# Patient Record
Sex: Male | Born: 1985 | Race: White | Hispanic: No | Marital: Single | Smoking: Former smoker
Health system: Southern US, Community
[De-identification: ages and names within clinical notes are randomized; demographics above are authoritative.]

## PROBLEM LIST (undated history)

## (undated) VITALS — BP 117/75 | HR 81 | Temp 97.5°F | Resp 16 | Ht 70.0 in | Wt 147.0 lb

## (undated) DIAGNOSIS — F329 Major depressive disorder, single episode, unspecified: Secondary | ICD-10-CM

## (undated) DIAGNOSIS — F602 Antisocial personality disorder: Secondary | ICD-10-CM

## (undated) DIAGNOSIS — R569 Unspecified convulsions: Secondary | ICD-10-CM

## (undated) DIAGNOSIS — F32A Depression, unspecified: Secondary | ICD-10-CM

## (undated) HISTORY — PX: KNEE SURGERY: SHX244

---

## 2007-07-16 ENCOUNTER — Emergency Department (HOSPITAL_COMMUNITY): Admission: EM | Admit: 2007-07-16 | Discharge: 2007-07-16 | Payer: Self-pay | Admitting: Emergency Medicine

## 2007-08-08 ENCOUNTER — Ambulatory Visit (HOSPITAL_COMMUNITY): Admission: RE | Admit: 2007-08-08 | Discharge: 2007-08-08 | Payer: Self-pay | Admitting: Orthopedic Surgery

## 2007-08-27 ENCOUNTER — Ambulatory Visit (HOSPITAL_BASED_OUTPATIENT_CLINIC_OR_DEPARTMENT_OTHER): Admission: RE | Admit: 2007-08-27 | Discharge: 2007-08-28 | Payer: Self-pay | Admitting: Orthopedic Surgery

## 2007-11-03 ENCOUNTER — Emergency Department (HOSPITAL_COMMUNITY): Admission: EM | Admit: 2007-11-03 | Discharge: 2007-11-03 | Payer: Self-pay | Admitting: Emergency Medicine

## 2007-11-18 ENCOUNTER — Emergency Department (HOSPITAL_COMMUNITY): Admission: EM | Admit: 2007-11-18 | Discharge: 2007-11-18 | Payer: Self-pay | Admitting: Emergency Medicine

## 2007-11-24 ENCOUNTER — Ambulatory Visit (HOSPITAL_COMMUNITY): Admission: RE | Admit: 2007-11-24 | Discharge: 2007-11-24 | Payer: Self-pay | Admitting: Emergency Medicine

## 2009-12-22 ENCOUNTER — Encounter: Admission: RE | Admit: 2009-12-22 | Discharge: 2009-12-22 | Payer: Self-pay | Admitting: Orthopedic Surgery

## 2010-12-03 ENCOUNTER — Encounter: Payer: Self-pay | Admitting: Orthopedic Surgery

## 2010-12-09 ENCOUNTER — Emergency Department (HOSPITAL_COMMUNITY)
Admission: EM | Admit: 2010-12-09 | Discharge: 2010-12-09 | Payer: Self-pay | Source: Home / Self Care | Admitting: Emergency Medicine

## 2010-12-09 LAB — COMPREHENSIVE METABOLIC PANEL
ALT: 31 U/L (ref 0–53)
Albumin: 4.1 g/dL (ref 3.5–5.2)
CO2: 26 mEq/L (ref 19–32)
Chloride: 109 mEq/L (ref 96–112)
Sodium: 142 mEq/L (ref 135–145)
Total Protein: 6.7 g/dL (ref 6.0–8.3)

## 2010-12-09 LAB — DIFFERENTIAL
Basophils Absolute: 0 10*3/uL (ref 0.0–0.1)
Eosinophils Relative: 3 % (ref 0–5)
Neutrophils Relative %: 47 % (ref 43–77)

## 2010-12-09 LAB — RAPID URINE DRUG SCREEN, HOSP PERFORMED
Barbiturates: NOT DETECTED
Cocaine: POSITIVE — AB
Tetrahydrocannabinol: NOT DETECTED

## 2010-12-09 LAB — CBC
Hemoglobin: 14.6 g/dL (ref 13.0–17.0)
MCV: 85.3 fL (ref 78.0–100.0)
RBC: 4.96 MIL/uL (ref 4.22–5.81)

## 2011-03-27 NOTE — Op Note (Signed)
NAMEGARRY, Caleb Rowe              ACCOUNT NO.:  [id number]   MEDICAL RECORD NO.:  [id number]          PATIENT TYPE:  AMB   LOCATION:  DSC                          FACILITY:  [facility]   PHYSICIAN:  Dyke Brackett, M.D.    DATE OF BIRTH:  [birth year]   DATE OF PROCEDURE:  08/27/2007  DATE OF DISCHARGE:  08/28/2007                               OPERATIVE REPORT   PRE AND POSTOPERATIVE DIAGNOSIS:  Anterior cruciate ligament tear, left  knee.   OPERATION:  Bone tendon bone autograft anterior cruciate ligament tear.  Repeat reconstruction.   SURGEON:  Dyke Brackett, M.D.   ASSISTANT:  Arnoldo Morale, PA   TOURNIQUET TIME:  1 hour 15 minutes.   DESCRIPTION OF PROCEDURE:  Sterile prep and drape, exsanguination of  leg, inflation of the tourniquet to 350, complete ACL tear noted,  significant laxity, positive Lachman.  No other abnormalities noted  relative to intra-articular pathology. 10-mm bone-to-bone autograft  reconstruction was harvested, prepared back table the standard fashion  room and harvested through a midline incision.  Notch was moderately  narrowed, notchplasty was carried out, guide pin was placed on the  anterior medial aspect of the tibia, over drilled for tunnel length  between 50 and 55 mm and then the Arthrex system used for the tibia to  create a pin hole on the femoral side at about the 1 o'clock position  for the left knee, over drilled with 10 mm reamer followed by placement  of the graft after a notch had been placed for the guide pin.  Guide pin  was placed with a 7 x 25 mm screw on the femur and a 7 x 20 on the  tibia.  Approximately 50% of the graft was in contact inside the tunnel  and 50% outside the tunnel on the tibial side, but a good stable graft  was obtained.  Lachman was traced, no impingement noted.  The donor site  was bone grafted on the patella followed by a running #1 Vicryl on the  tendon and interrupted 0-0 and 2-0 Vicryl.  Lightly compressive  sterile  dressing applied.  Taken to recovery room in stable condition.  Tourniquet was released after application of dressing.      Dyke Brackett, M.D.  Electronically Signed     WDC/MEDQ  D:  08/27/2007  T:  08/28/2007  Job:  [id number]

## 2011-08-22 LAB — POCT HEMOGLOBIN-HEMACUE: Operator id: 128471

## 2012-04-09 ENCOUNTER — Emergency Department: Payer: Self-pay | Admitting: *Deleted

## 2012-04-11 ENCOUNTER — Emergency Department: Payer: Self-pay | Admitting: Emergency Medicine

## 2012-04-18 ENCOUNTER — Encounter (HOSPITAL_COMMUNITY): Payer: Self-pay | Admitting: *Deleted

## 2012-04-18 ENCOUNTER — Emergency Department (HOSPITAL_COMMUNITY)
Admission: EM | Admit: 2012-04-18 | Discharge: 2012-04-18 | Disposition: A | Discharge status: Home / Self Care | Payer: BC Managed Care – PPO | Attending: Emergency Medicine | Admitting: Emergency Medicine

## 2012-04-18 DIAGNOSIS — M25519 Pain in unspecified shoulder: Secondary | ICD-10-CM | POA: Insufficient documentation

## 2012-04-18 MED ORDER — TRAMADOL HCL 50 MG PO TABS: 50.0000 mg | ORAL_TABLET | Freq: Four times a day (QID) | ORAL | Status: AC | PRN

## 2012-04-18 MED ORDER — KETOROLAC TROMETHAMINE 60 MG/2ML IM SOLN
60.0000 mg | Freq: Once | INTRAMUSCULAR | Status: AC
  Administered 2012-04-18: 60 mg via INTRAMUSCULAR
  Filled 2012-04-18: qty 2

## 2012-04-18 NOTE — Discharge Instructions (Signed)
Acromioclavicular Injuries  The AC (acromioclavicular) joint is the joint in the shoulder where the collarbone (clavicle) meets the shoulder blade (scapula). The part of the shoulder blade connected to the collarbone is called the acromion. Common problems with and treatments for the AC joint are detailed below.  ARTHRITIS  Arthritis occurs when the joint has been injured and the smooth padding between the joints (cartilage) is lost. This is the wear and tear seen in most joints of the body if they have been overused. This causes the joint to produce pain and swelling which is worse with activity.   AC JOINT SEPARATION  AC joint separation means that the ligaments connecting the acromion of the shoulder blade and collarbone have been damaged, and the two bones no longer line up. AC separations can be anywhere from mild to severe, and are "graded" depending upon which ligaments are torn and how badly they are torn.   Grade I Injury: the least damage is done, and the AC joint still lines up.   Grade II Injury: damage to the ligaments which reinforce the AC joint. In a Grade II injury, these ligaments are stretched but not entirely torn. When stressed, the AC joint becomes painful and unstable.   Grade III Injury: AC and secondary ligaments are completely torn, and the collarbone is no longer attached to the shoulder blade. This results in deformity; a prominence of the end of the clavicle.  AC JOINT FRACTURE  AC joint fracture means that there has been a break in the bones of the AC joint, usually the end of the clavicle.  TREATMENT  TREATMENT OF AC ARTHRITIS   There is currently no way to replace the cartilage damaged by arthritis. The best way to improve the condition is to decrease the activities which aggravate the problem. Application of ice to the joint helps decrease pain and soreness (inflammation). The use of non-steroidal anti-inflammatory medication is helpful.   If less conservative measures do not  work, then cortisone shots (injections) may be used. These are anti-inflammatories; they decrease the soreness in the joint and swelling.   If non-surgical measures fail, surgery may be recommended. The procedure is generally removal of a portion of the end of the clavicle. This is the part of the collarbone closest to your acromion which is stabilized with ligaments to the acromion of the shoulder blade. This surgery may be performed using a tube-like instrument with a light (arthroscope) for looking into a joint. It may also be performed as an open surgery through a small incision by the surgeon. Most patients will have good range of motion within 6 weeks and may return to all activity including sports by 8-12 weeks, barring complications.  TREATMENT OF AN AC SEPARATION   The initial treatment is to decrease pain. This is best accomplished by immobilizing the arm in a sling and placing an ice pack to the shoulder for 20 to 30 minutes every 2 hours as needed. As the pain starts to subside, it is important to begin moving the fingers, wrist, elbow and eventually the shoulder in order to prevent a stiff or "frozen" shoulder. Instruction on when and how much to move the shoulder will be provided by your caregiver. The length of time needed to regain full motion and function depends on the amount or grade of the injury. Recovery from a Grade I AC separation usually takes 10 to 14 days, whereas a Grade III may take 6 to 8 weeks.   Grade   I and II separations usually do not require surgery. Even Grade III injuries usually allow return to full activity with few restrictions. Treatment is also based on the activity demands of the injured shoulder. For example, a high level quarterback with an injured throwing arm will receive more aggressive treatment than someone with a desk job who rarely uses his/her arm for strenuous activities. In some cases, a painful lump may persist which could require a later surgery. Surgery  can be very successful, but the benefits must be weighed against the potential risks.  TREATMENT OF AN AC JOINT FRACTURE  Fracture treatment depends on the type of fracture. Sometimes a splint or sling may be all that is required. Other times surgery may be required for repair. This is more frequently the case when the ligaments supporting the clavicle are completely torn. Your caregiver will help you with these decisions and together you can decide what will be the best treatment.  HOME CARE INSTRUCTIONS    Apply ice to the injury for 15 to 20 minutes each hour while awake for 2 days. Put the ice in a plastic bag and place a towel between the bag of ice and skin.   If a sling has been applied, wear it constantly for as long as directed by your caregiver, even at night. The sling or splint can be removed for bathing or showering or as directed. Be sure to keep the shoulder in the same place as when the sling is on. Do not lift the arm.   If a figure-of-eight splint has been applied it should be tightened gently by another person every day. Tighten it enough to keep the shoulders held back. Allow enough room to place the index finger between the body and strap. Loosen the splint immediately if there is numbness or tingling in the hands.   Take over-the-counter or prescription medicines for pain, discomfort or fever as directed by your caregiver.   If you or your child has received a follow up appointment, it is very important to keep that appointment in order to avoid long term complications, chronic pain or disability.  SEEK MEDICAL CARE IF:    The pain is not relieved with medications.   There is increased swelling or discoloration that continues to get worse rather than better.   You or your child has been unable to follow up as instructed.   There is progressive numbness and tingling in the arm, forearm or hand.  SEEK IMMEDIATE MEDICAL CARE IF:    The arm is numb, cold or pale.   There is increasing  pain in the hand, forearm or fingers.  MAKE SURE YOU:    Understand these instructions.   Will watch your condition.   Will get help right away if you are not doing well or get worse.  Document Released: 08/08/2005 Document Revised: 10/18/2011 Document Reviewed: 01/31/2009  Exit[employer] Patient Information 2012 [employer].

## 2012-04-18 NOTE — ED Provider Notes (Signed)
History     CSN: [id number]  Arrival date & time 04/18/12  0805   First MD Initiated Contact with Patient 04/18/12 0815      Chief Complaint  Patient presents with  . Neck Pain    (Consider location/radiation/quality/duration/timing/severity/associated sxs/prior treatment) HPI  Patient presents to the ED because of a shoulder injury from a couple of days ago still hurts. He was seen at [facility] and given Flexeril which he says does not help his pain and only makes him feel very sleepy. 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 He denies any numbness or tingling in his arm. He denies weakness. Xrays were done at [facility] and were normal. He has an appointment with Dr. Rosita Kea (ortho) coming up on the 13th. Patient is in no acute distress needs work note and would like to try a different pain medication. No bowel or urinary incontinence.   History reviewed. No pertinent past medical history.  Past Surgical History  Procedure Date  . Knee surgery     No family history on file.  History  Substance Use Topics  . Smoking status: Never Smoker   . Smokeless tobacco: Not on file  . Alcohol Use: No      Review of Systems   HEENT: denies blurry vision or change in hearing PULMONARY: Denies difficulty breathing and SOB CARDIAC: denies chest pain or heart palpitations MUSCULOSKELETAL:  denies being unable to ambulate ABDOMEN AL: denies abdominal pain GU: denies loss of bowel or urinary control NEURO: denies numbness and tingling in extremities    Allergies  Review of patient's allergies indicates no known allergies.  Home Medications   Current Outpatient Rx  Name Route Sig Dispense Refill  . BUPROPION HCL ER (XL) 150 MG PO TB24 Oral Take 150 mg by mouth daily.    . SERTRALINE HCL 100 MG PO TABS Oral Take 100 mg by mouth daily.    . TRAMADOL HCL 50 MG PO TABS Oral Take 1 tablet (50 mg total) by mouth every 6 (six) hours as  needed for pain. 15 tablet 0    BP 140/88  Pulse 87  Temp(Src) 97.6 F (36.4 C) (Oral)  Resp 20  SpO2 99%  Physical Exam  Nursing note and vitals reviewed. Constitutional: He appears well-developed and well-nourished. No distress.  HENT:  Head: Normocephalic and atraumatic.  Eyes: Pupils are equal, round, and reactive to light.  Neck: Normal range of motion. Neck supple.  Cardiovascular: Normal rate and regular rhythm.   Pulmonary/Chest: Effort normal.  Abdominal: Soft.  Musculoskeletal:       Right shoulder: He exhibits decreased range of motion (due to pain), tenderness (to palpation), deformity, pain and spasm. He exhibits no bony tenderness, no swelling, no effusion, no crepitus, no laceration, normal pulse and normal strength.        Equal strength to bilateral upper extremities. Neurosensory  function adequate to both arms. Skin color is normal. Skin is warm and moist. I see no step off deformity of the neck, no bony tenderness. Pt is able to ambulate without limp. Pain is relieved when sitting in certain positions. ROM is decreased due to pain. No crepitus, laceration, effusion, swelling.  Pulses are normal   Neurological: He is alert.  Skin: Skin is warm and dry.    ED Course  Procedures (including critical care time)  Labs Reviewed - No data to display No results found.   1. Shoulder  pain       MDM  Patient given 60mg  IM Toradol in ED and an rx for Ultram. He has follow-up appointment with Ortho on the 13th. I gave patient a work note and advised him to stretch, rest and Ice/heat shoulder.  Pt has been advised of the symptoms that warrant their return to the ED. Patient has voiced understanding and has agreed to follow-up with the PCP or specialist.         Dorthula Matas, PA 04/18/12 [phone]

## 2012-04-18 NOTE — ED Notes (Signed)
Patient states he pulled something in his neck at work.  He was seen at [facility] and given flexeril,  Reports this meds just made him sleepy but did not help his pain.  He performs heavy lifting at work

## 2012-04-18 NOTE — ED Provider Notes (Signed)
Medical screening examination/treatment/procedure(s) were performed by non-physician practitioner and as supervising physician I was immediately available for consultation/collaboration.   Lurdes Haltiwanger, MD 04/18/12 1617 

## 2012-06-09 ENCOUNTER — Encounter (HOSPITAL_COMMUNITY): Payer: Self-pay | Admitting: Emergency Medicine

## 2012-06-09 ENCOUNTER — Emergency Department (HOSPITAL_COMMUNITY)
Admission: EM | Admit: 2012-06-09 | Discharge: 2012-06-10 | Disposition: A | Discharge status: Other Acute Inpatient Hospital | Payer: BC Managed Care – PPO | Attending: Emergency Medicine | Admitting: Emergency Medicine

## 2012-06-09 DIAGNOSIS — F1310 Sedative, hypnotic or anxiolytic abuse, uncomplicated: Secondary | ICD-10-CM | POA: Insufficient documentation

## 2012-06-09 DIAGNOSIS — F1110 Opioid abuse, uncomplicated: Secondary | ICD-10-CM

## 2012-06-09 HISTORY — DX: Major depressive disorder, single episode, unspecified: F32.9

## 2012-06-09 HISTORY — DX: Unspecified convulsions: R56.9

## 2012-06-09 HISTORY — DX: Antisocial personality disorder: F60.2

## 2012-06-09 HISTORY — DX: Depression, unspecified: F32.A

## 2012-06-09 LAB — ACETAMINOPHEN LEVEL: Acetaminophen (Tylenol), Serum: <15 ug/mL (ref 10–30)

## 2012-06-09 LAB — COMPREHENSIVE METABOLIC PANEL
ALT: 13 U/L (ref 0–53)
Albumin: 4.4 g/dL (ref 3.5–5.2)
Alkaline Phosphatase: 61 U/L (ref 39–117)
BUN: 14 mg/dL (ref 6–23)
CO2: 30 mEq/L (ref 19–32)
Calcium: 9.8 mg/dL (ref 8.4–10.5)
Chloride: 101 mEq/L (ref 96–112)
GFR calc Af Amer: >90 mL/min (ref >90)
GFR calc non Af Amer: >90 mL/min (ref >90)
Glucose, Bld: 101 mg/dL — ABNORMAL HIGH (ref 70–99)
Sodium: 140 mEq/L (ref 135–145)
Total Bilirubin: 0.6 mg/dL (ref 0.3–1.2)

## 2012-06-09 LAB — RAPID URINE DRUG SCREEN, HOSP PERFORMED
Amphetamines: NOT DETECTED
Barbiturates: POSITIVE — AB
Benzodiazepines: NOT DETECTED
Cocaine: NOT DETECTED
Opiates: NOT DETECTED
Tetrahydrocannabinol: NOT DETECTED

## 2012-06-09 LAB — ETHANOL: Alcohol, Ethyl (B): <11 mg/dL (ref 0–11)

## 2012-06-09 LAB — CBC
Hemoglobin: 15.7 g/dL (ref 13.0–17.0)
MCV: 86.8 fL (ref 78.0–100.0)
RBC: 5.14 MIL/uL (ref 4.22–5.81)

## 2012-06-09 MED ORDER — NICOTINE 21 MG/24HR TD PT24
21.0000 mg | MEDICATED_PATCH | Freq: Every day | TRANSDERMAL | Status: DC
  Administered 2012-06-09: 21 mg via TRANSDERMAL
  Filled 2012-06-09: qty 1

## 2012-06-09 MED ORDER — ALUM & MAG HYDROXIDE-SIMETH 200-200-20 MG/5ML PO SUSP: 30.0000 mL | ORAL | Status: DC | PRN

## 2012-06-09 MED ORDER — ONDANSETRON HCL 4 MG PO TABS: 4.0000 mg | ORAL_TABLET | Freq: Three times a day (TID) | ORAL | Status: DC | PRN

## 2012-06-09 MED ORDER — ACETAMINOPHEN 325 MG PO TABS: 650.0000 mg | ORAL_TABLET | ORAL | Status: DC | PRN

## 2012-06-09 MED ORDER — LORAZEPAM 1 MG PO TABS: 1.0000 mg | ORAL_TABLET | Freq: Three times a day (TID) | ORAL | Status: DC | PRN

## 2012-06-09 NOTE — ED Provider Notes (Addendum)
Requesting detox from narcotics. Presently patient alert Glasgow Coma Score 15 ambulatory no distress  Doug Sou, MD 06/09/12 1119  Doug Sou, MD 06/09/12 1728

## 2012-06-09 NOTE — ED Notes (Signed)
Pt states he wants detox from heroin and suboxone.  Pt states that "if it's around I'm going to use it."  Denies SI/HI but says he felt suicidal last week when his girlfriend confronted him about his drug use.  Very pleasant during assessment.

## 2012-06-09 NOTE — ED Notes (Signed)
Pt presenting to ed with c/o wanting detox from pills pt states he is taking any pill that he can get his hands on. Pt states he wants to hurt himself and others but he does not have an exact plan. Pt is alert and oriented at this time. Pt states he also has noticed he has a very bad temper lately and anything sets him off.

## 2012-06-09 NOTE — BH Assessment (Signed)
Assessment Note   Caleb Rowe is a 26 y.o. male who presents to [facility] with req for detox from Pain Pills( Suboxone, Percocet, Roxicet).  Pt denies SI/HI/Psych.  Pt reports the following: pt has been abusing pain pills for 3 yrs, says started using b/c "peers suggested it" and it enhanced his "partying experience".  Pt has not prior SA treatment and no complaints of current w/d sxs, however states that when he experiences w/d sxs, it is tremors, nausea, body aches, and restless legs.  Pt has no criminal chgs or court dates.  Pt is prescribed 150mg  of Zoloft and last used on 06/08/12.  Pt uses: Percocet 20-10mg s, 4x's wkly, last use was 1 wk ago; Roxicet(Roxys) 6-30mg s, 4 x's wkly, last use was 1 wk ago; Suboxone 2-8mg s film strips daily, last use was 06/08/12.  Pt says he came in today for treatment b/c of relational issues with girlfriend secondary to SA--confronted him about drug use. Pt + for  Barbiturates in UDS, says he has also used his girlfriend's meds for migraine HA, unsure of the name of the medication.    Axis I: Opioid Dep; Mood Disorder NOS  Axis II: Deferred Axis III:  Past Medical History  Diagnosis Date  . Seizures   . Depression   . Sociopathic personality disorder    Axis IV: other psychosocial or environmental problems, problems related to social environment and problems with primary support group Axis V: 51-60 moderate symptoms  Past Medical History:  Past Medical History  Diagnosis Date  . Seizures   . Depression   . Sociopathic personality disorder     Past Surgical History  Procedure Date  . Knee surgery     Family History: No family history on file.  Social History:  reports that he has been smoking Cigarettes.  He does not have any smokeless tobacco history on file. He reports that he uses illicit drugs (Other-see comments and Barbituates). He reports that he does not drink alcohol.  Additional Social History:  Alcohol / Drug Use Pain Medications:  Abusing--Pain Pills(Percocet, Roxicet, Suboxone) Prescriptions: None  Over the Counter: None  History of alcohol / drug use?: Yes Longest period of sobriety (when/how long): None  Negative Consequences of Use: Personal relationships;Work / Programmer, multimedia Withdrawal Symptoms: Other (Comment) (No current w/d sxs; c/o of lethargy ) Substance #1 Name of Substance 1: Percocet  1 - Age of First Use: 23 YOM 1 - Amount (size/oz): 20-10MG  1 - Frequency: 4x's Wkly  1 - Duration: On-going  1 - Last Use / Amount: 1 WK Ago  Substance #2 Name of Substance 2: Roxicet  2 - Age of First Use: 23 YOM  2 - Amount (size/oz): 6-30MG  2 - Frequency: 4x's Wkly  2 - Duration: On-going  2 - Last Use / Amount: 1 Wk Ago  Substance #3 Name of Substance 3: Suboxone  3 - Age of First Use: 23 YOM  3 - Amount (size/oz): 2-8MG  Film Strips  3 - Frequency: Daily  3 - Duration: On-going  3 - Last Use / Amount: 06/08/12  CIWA: CIWA-Ar BP: 125/78 mmHg Pulse Rate: 66  Nausea and Vomiting: no nausea and no vomiting Tactile Disturbances: none Tremor: no tremor Auditory Disturbances: not present Paroxysmal Sweats: no sweat visible Visual Disturbances: not present Anxiety: no anxiety, at ease Headache, Fullness in Head: none present Agitation: normal activity Orientation and Clouding of Sensorium: oriented and can do serial additions CIWA-Ar Total: 0  COWS: Clinical Opiate Withdrawal Scale (COWS) Resting  Pulse Rate: Pulse Rate 80 or below Sweating: No report of chills or flushing Restlessness: Able to sit still Pupil Size: Pupils pinned or normal size for room light Bone or Joint Aches: Not present Runny Nose or Tearing: Not present GI Upset: No GI symptoms Tremor: No tremor Yawning: No yawning Anxiety or Irritability: None Gooseflesh Skin: Skin is smooth COWS Total Score: 0   Allergies: No Known Allergies  Home Medications:  (Not in a hospital admission)  OB/GYN Status:  No LMP for male patient.  General  Assessment Data Location of Assessment: [facility] ED Living Arrangements: Alone Can pt return to current living arrangement?: Yes Admission Status: Voluntary Is patient capable of signing voluntary admission?: Yes Transfer from: Acute Hospital Referral Source: MD  Education Status Is patient currently in school?: No Current Grade: None  Highest grade of school patient has completed: 12th  Name of school: None  Contact person: None   Risk to self Suicidal Ideation: No Suicidal Intent: No Is patient at risk for suicide?: No Suicidal Plan?: No Access to Means: No What has been your use of drugs/alcohol within the last 12 months?: Abusing Pain Pills(Suboxone, Roxicet, Percocet)  Previous Attempts/Gestures: No How many times?: 0  Other Self Harm Risks: None  Triggers for Past Attempts: None known Intentional Self Injurious Behavior: None Family Suicide History: No Recent stressful life event(s): Other (Comment) (SA use, Relational w/girlfriend due 2nd to SA ) Persecutory voices/beliefs?: No Depression: Yes Depression Symptoms: Loss of interest in usual pleasures Substance abuse history and/or treatment for substance abuse?: Yes Suicide prevention information given to non-admitted patients: Not applicable  Risk to Others Homicidal Ideation: No Thoughts of Harm to Others: No Current Homicidal Intent: No Current Homicidal Plan: No Access to Homicidal Means: No Identified Victim: None  History of harm to others?: No Assessment of Violence: None Noted Violent Behavior Description: None  Does patient have access to weapons?: No Criminal Charges Pending?: No Does patient have a court date: No  Psychosis Hallucinations: None noted Delusions: None noted  Mental Status Report Appear/Hygiene: Other (Comment) (Appropriate ) Eye Contact: Good Motor Activity: Unremarkable Speech: Logical/coherent Level of Consciousness: Alert Mood: Sad Affect: Appropriate to circumstance Anxiety  Level: None Thought Processes: Coherent;Relevant Judgement: Unimpaired Orientation: Person;Place;Time;Situation Obsessive Compulsive Thoughts/Behaviors: None  Cognitive Functioning Concentration: Normal Memory: Recent Intact;Remote Intact IQ: Average Insight: Good Impulse Control: Good Appetite: Good Weight Loss: 0  Weight Gain: 0  Sleep: No Change Total Hours of Sleep: 6  Vegetative Symptoms: None  ADLScreening [facility]) Patient's cognitive ability adequate to safely complete daily activities?: Yes Patient able to express need for assistance with ADLs?: Yes Independently performs ADLs?: Yes  Abuse/Neglect [facility]) Physical Abuse: Denies Verbal Abuse: Denies Sexual Abuse: Denies  Prior Inpatient Therapy Prior Inpatient Therapy: No Prior Therapy Dates: None  Prior Therapy Facilty/Provider(s): None  Reason for Treatment: None   Prior Outpatient Therapy Prior Outpatient Therapy: No Prior Therapy Dates: None  Prior Therapy Facilty/Provider(s): None  Reason for Treatment: None   ADL Screening (condition at time of admission) Patient's cognitive ability adequate to safely complete daily activities?: Yes Patient able to express need for assistance with ADLs?: Yes Independently performs ADLs?: Yes Weakness of Legs: None Weakness of Arms/Hands: None  Home Assistive Devices/Equipment Home Assistive Devices/Equipment: None  Therapy Consults (therapy consults require a physician order) PT Evaluation Needed: No OT Evalulation Needed: No SLP Evaluation Needed: No Abuse/Neglect Assessment (Assessment to be complete while patient is alone) Physical Abuse: Denies Verbal Abuse: Denies  Sexual Abuse: Denies Exploitation of patient/patient's resources: Denies Self-Neglect: Denies Values / Beliefs Cultural Requests During Hospitalization: None Spiritual Requests During Hospitalization: None Consults Spiritual Care Consult Needed: No Social Work Consult Needed:  No Merchant navy officer (For Healthcare) Advance Directive: Patient does not have advance directive;Patient would not like information Pre-existing out of facility DNR order (yellow form or pink MOST form): No Nutrition Screen Diet: Regular Unintentional weight loss greater than 10lbs within the last month: No Problems chewing or swallowing foods and/or liquids: No Home Tube Feeding or Total Parenteral Nutrition (TPN): No Patient appears severely malnourished: No  Additional Information 1:1 In Past 12 Months?: No CIRT Risk: No Elopement Risk: No Does patient have medical clearance?: Yes     Disposition:  Disposition Disposition of Patient: Inpatient treatment program;Referred to [facility] ) Type of inpatient treatment program: Adult Patient referred to: Other (Comment) [facility] )  On Site Evaluation by:   Reviewed with Physician:     Murrell Redden 06/09/2012 10:41 PM

## 2012-06-09 NOTE — ED Provider Notes (Signed)
Medical screening examination/treatment/procedure(s) were conducted as a shared visit with non-physician practitioner(s) and myself.  I personally evaluated the patient during the encounter  Doug Sou, MD 06/09/12 1729

## 2012-06-09 NOTE — ED Notes (Signed)
Security called to wand and search one patient belonging bag.

## 2012-06-09 NOTE — ED Notes (Signed)
Security wanded and searched patient belongings.
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
## 2012-06-10 ENCOUNTER — Inpatient Hospital Stay (HOSPITAL_COMMUNITY)
Admission: EM | Admit: 2012-06-10 | Discharge: 2012-06-12 | DRG: 430 | Disposition: A | Discharge status: Home / Self Care | Payer: BC Managed Care – PPO | Source: Ambulatory Visit | Attending: Psychiatry | Admitting: Psychiatry

## 2012-06-10 ENCOUNTER — Encounter (HOSPITAL_COMMUNITY): Payer: Self-pay | Admitting: Emergency Medicine

## 2012-06-10 DIAGNOSIS — F3130 Bipolar disorder, current episode depressed, mild or moderate severity, unspecified: Principal | ICD-10-CM | POA: Diagnosis present

## 2012-06-10 DIAGNOSIS — F1910 Other psychoactive substance abuse, uncomplicated: Secondary | ICD-10-CM

## 2012-06-10 DIAGNOSIS — F319 Bipolar disorder, unspecified: Secondary | ICD-10-CM

## 2012-06-10 DIAGNOSIS — F3132 Bipolar disorder, current episode depressed, moderate: Secondary | ICD-10-CM | POA: Diagnosis present

## 2012-06-10 DIAGNOSIS — F602 Antisocial personality disorder: Secondary | ICD-10-CM | POA: Diagnosis present

## 2012-06-10 DIAGNOSIS — F1120 Opioid dependence, uncomplicated: Secondary | ICD-10-CM | POA: Diagnosis present

## 2012-06-10 DIAGNOSIS — F17200 Nicotine dependence, unspecified, uncomplicated: Secondary | ICD-10-CM | POA: Diagnosis present

## 2012-06-10 MED ORDER — CARBAMAZEPINE 200 MG PO TABS
200.0000 mg | ORAL_TABLET | Freq: Two times a day (BID) | ORAL | Status: DC
  Administered 2012-06-10 – 2012-06-12 (×4): 200 mg via ORAL
  Filled 2012-06-10 (×8): qty 1

## 2012-06-10 MED ORDER — ALUM & MAG HYDROXIDE-SIMETH 200-200-20 MG/5ML PO SUSP: 30.0000 mL | ORAL | Status: DC | PRN

## 2012-06-10 MED ORDER — CLONIDINE HCL 0.1 MG PO TABS
0.1000 mg | ORAL_TABLET | Freq: Every day | ORAL | Status: DC
  Filled 2012-06-10: qty 1

## 2012-06-10 MED ORDER — DICYCLOMINE HCL 20 MG PO TABS
20.0000 mg | ORAL_TABLET | Freq: Four times a day (QID) | ORAL | Status: DC | PRN
  Administered 2012-06-10: 20 mg via ORAL
  Filled 2012-06-10: qty 1

## 2012-06-10 MED ORDER — CHLORDIAZEPOXIDE HCL 25 MG PO CAPS
25.0000 mg | ORAL_CAPSULE | Freq: Four times a day (QID) | ORAL | Status: DC
Start: 2012-06-10 — End: 2012-06-10
  Administered 2012-06-10 (×2): 25 mg via ORAL
  Filled 2012-06-10 (×4): qty 1

## 2012-06-10 MED ORDER — ONDANSETRON 4 MG PO TBDP: 4.0000 mg | ORAL_TABLET | Freq: Four times a day (QID) | ORAL | Status: DC | PRN

## 2012-06-10 MED ORDER — MAGNESIUM HYDROXIDE 400 MG/5ML PO SUSP: 30.0000 mL | Freq: Every day | ORAL | Status: DC | PRN

## 2012-06-10 MED ORDER — CHLORDIAZEPOXIDE HCL 25 MG PO CAPS: 25.0000 mg | ORAL_CAPSULE | ORAL | Status: DC

## 2012-06-10 MED ORDER — CHLORDIAZEPOXIDE HCL 25 MG PO CAPS: 25.0000 mg | ORAL_CAPSULE | Freq: Every day | ORAL | Status: DC

## 2012-06-10 MED ORDER — HYDROXYZINE HCL 25 MG PO TABS: 25.0000 mg | ORAL_TABLET | Freq: Four times a day (QID) | ORAL | Status: DC | PRN

## 2012-06-10 MED ORDER — CHLORDIAZEPOXIDE HCL 25 MG PO CAPS: 25.0000 mg | ORAL_CAPSULE | Freq: Three times a day (TID) | ORAL | Status: DC

## 2012-06-10 MED ORDER — CHLORDIAZEPOXIDE HCL 25 MG PO CAPS: 25.0000 mg | ORAL_CAPSULE | Freq: Four times a day (QID) | ORAL | Status: DC | PRN

## 2012-06-10 MED ORDER — SERTRALINE HCL 50 MG PO TABS
150.0000 mg | ORAL_TABLET | Freq: Every day | ORAL | Status: DC
  Administered 2012-06-10: 150 mg via ORAL

## 2012-06-10 MED ORDER — NAPROXEN 500 MG PO TABS
500.0000 mg | ORAL_TABLET | Freq: Two times a day (BID) | ORAL | Status: DC | PRN
  Administered 2012-06-10: 500 mg via ORAL
  Filled 2012-06-10: qty 1

## 2012-06-10 MED ORDER — SERTRALINE HCL 50 MG PO TABS
150.0000 mg | ORAL_TABLET | Freq: Every day | ORAL | Status: DC
  Administered 2012-06-11 – 2012-06-12 (×2): 150 mg via ORAL
  Filled 2012-06-10 (×5): qty 3

## 2012-06-10 MED ORDER — LOPERAMIDE HCL 2 MG PO CAPS: 2.0000 mg | ORAL_CAPSULE | ORAL | Status: DC | PRN

## 2012-06-10 MED ORDER — ADULT MULTIVITAMIN W/MINERALS CH
1.0000 | ORAL_TABLET | Freq: Every day | ORAL | Status: DC
  Administered 2012-06-10 – 2012-06-12 (×3): 1 via ORAL
  Filled 2012-06-10 (×5): qty 1

## 2012-06-10 MED ORDER — CLONIDINE HCL 0.1 MG PO TABS
0.1000 mg | ORAL_TABLET | ORAL | Status: DC
  Administered 2012-06-12: 0.1 mg via ORAL
  Filled 2012-06-10 (×4): qty 1

## 2012-06-10 MED ORDER — ACETAMINOPHEN 325 MG PO TABS
650.0000 mg | ORAL_TABLET | Freq: Four times a day (QID) | ORAL | Status: DC | PRN
  Administered 2012-06-10 – 2012-06-11 (×2): 650 mg via ORAL

## 2012-06-10 MED ORDER — CLONIDINE HCL 0.1 MG PO TABS
0.1000 mg | ORAL_TABLET | Freq: Four times a day (QID) | ORAL | Status: AC
  Administered 2012-06-10 – 2012-06-11 (×6): 0.1 mg via ORAL
  Filled 2012-06-10 (×6): qty 1

## 2012-06-10 MED ORDER — THIAMINE HCL 100 MG/ML IJ SOLN: 100.0000 mg | Freq: Once | INTRAMUSCULAR | Status: DC

## 2012-06-10 MED ORDER — VITAMIN B-1 100 MG PO TABS
100.0000 mg | ORAL_TABLET | Freq: Every day | ORAL | Status: DC
  Administered 2012-06-11 – 2012-06-12 (×2): 100 mg via ORAL
  Filled 2012-06-10 (×4): qty 1

## 2012-06-10 MED ORDER — METHOCARBAMOL 500 MG PO TABS: 500.0000 mg | ORAL_TABLET | Freq: Three times a day (TID) | ORAL | Status: DC | PRN

## 2012-06-10 MED ORDER — NICOTINE 21 MG/24HR TD PT24
21.0000 mg | MEDICATED_PATCH | Freq: Every morning | TRANSDERMAL | Status: DC
  Administered 2012-06-10 – 2012-06-12 (×3): 21 mg via TRANSDERMAL
  Filled 2012-06-10 (×5): qty 1

## 2012-06-10 NOTE — ED Notes (Signed)
Plan for transfer discussed with pt and he verbalizes understanding. Report called to [facility] adult unit. Pt departs unit in safe and stable condition.

## 2012-06-10 NOTE — Tx Team (Addendum)
Interdisciplinary Treatment Plan Update (Adult)  Date:  06/10/2012  Time Reviewed:  4:15 PM   Progress in Treatment: Attending groups: Yes Participating in groups:  Yes Taking medication as prescribed:  Yes Tolerating medication: Yes Family/Significant othe contact made:  No, contact with family not indicated - has capacity and no SI Patient understands diagnosis: Yes Discussing patient identified problems/goals with staff:  Yes Medical problems stabilized or resolved: Yes Denies suicidal/homicidal ideation: Yes Issues/concerns per patient self-inventory:  No  Other:  New problem(s) identified: None  Reason for Continuation of Hospitalization: Anxiety Medication stabilization Withdrawal symptoms  Interventions implemented related to continuation of hospitalization:  Medication stabilization, safety checks q 15 mins, group attendance  Additional comments:  Estimated length of stay:   3-5 days  Discharge Plan:  Discharge to further SA treatment center  New goal(s):  Review of initial/current patient goals per problem list:   1.  Goal(s): Complete detox protocol for opioid medications  Met:  No  Target date: by discharge  As evidenced by: Timothy Lasso is beginning his detox protocol today  2.  Goal (s): Decrease depressive symptoms to rating of 4 or less  Met:  Yes  Target date: by discharge  As evidenced by: Zack rates depression at a 2  today  3.  Goal(s): Decrease anxiety symptoms to a rating of 4 or less  Met:  No  Target date: by discharge  As evidenced by: Zack rates anxiety at 7 today  4.  Goal(s): Medication stabilization  Met:  No  Target date: by discharge   As evidenced by: Timothy Lasso is not yet reporting the his medications are working to reduce symptoms  Attendees: Patient:     Family:     Physician:      Nursing:   Neill Loft, RN 06/10/2012 4:15 PM  Case Manager:  Estevan Ryder, LCSWA 06/10/2012 4:15 PM  Counselor:  Angus Palms, LCSW  06/10/2012 4:15 PM  Other: Serena Colonel, NP 06/10/2012 4:15 PM  Other:  Izola Price, RN 06/10/2012 4:15 PM  Other:     Other:      Scribe for Treatment Team:   [name], 06/10/2012 4:15 PM

## 2012-06-10 NOTE — H&P (Signed)
Psychiatric Admission Assessment Adult  Patient Identification:  Caleb Rowe  Date of Evaluation:  06/10/2012  Chief Complaint:  Mood Disorder  History of Present Illness: This is a 26 year old Caucasian male, admitted to [facility] from the [facility] ED with complaints of erratic drug use, requesting detoxification. Patient reports, "I have problems with opiates. I take any drugs that I can get my hands on. 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 I stayed cleaned momentarily after that, then I went full blown to using all kinds of pills. Part of the problem was that I had a knee surgery right after getting cleaned from heroin. I was prescribed percocet for my pain, and the rest is history. I can't seem to resist using drugs. I have been using drugs non-stop x 4 years now. I work in an area whereby doing drugs is the norm. Drugs are around at all time. I have been very impulsive all my life. If it gets in my head, what ever it is I'm doing it. Drugs makes me feel good. But it has affected my life in many different ways. I have wondered if my drug use is because of some mental illness within me. I was diagnosed with bipolar disorder and anti-social personality disorder a long time ago. I have conscience, but it does always come out when I needed it. I am saying this because I lie like a dog looking at someone right in the eye without feeling remorseful. I lie to get my way. I am tired of it".   Mood Symptoms:  Mood Swings, Past 2 Weeks,  Depression Symptoms:  feelings of worthlessness/guilt,  (Hypo) Manic Symptoms:  Distractibility, Impulsivity, Irritable Mood,  Anxiety Symptoms:  Excessive Worry,  Psychotic Symptoms:  Hallucinations: None  PTSD Symptoms: Had a traumatic exposure:  "My best-friend died in a care wreck a couple months ago"  Past Psychiatric History: Diagnosis: Polysubstance abuse/dependency    Hospitalizations: [facility]  Outpatient Care: [facility]  Substance Abuse Care: None reported  Self-Mutilation: None reported  Suicidal Attempts: Denies attempts and or thoughts  Violent Behaviors: None reported   Past Medical History:   Past Medical History  Diagnosis Date  . Seizures   . Depression   . Sociopathic personality disorder      Allergies:  No Known Allergies  PTA Medications: Prescriptions prior to admission  Medication Sig Dispense Refill  . sertraline (ZOLOFT) 100 MG tablet Take 150 mg by mouth daily. Pt takes 1.5 tab for 150 mg dose         Substance Abuse History in the last 12 months: Substance Age of 1st Use Last Use Amount Specific Type  Nicotine 15 Prior to hosp 1&1/2 packs daily Cigarettes  Alcohol 13 "I drink 5 bottles of beer occasionally on monthly basis" 5 bottles Beer  Cannabis Denies use     Opiates 15 "I used prior to hospital"  As many as I can get Opana, Roxys,  Cocaine Denies use     Methamphetamines Denies use     LSD Denies use     Ecstasy Denies use     Benzodiazepines Denies use     Caffeine      Inhalants      Others:                         Consequences of Substance  Abuse: Medical Consequences:  Liver damage Legal Consequences:  Arrests, jail time Family Consequences:  Family discord  Social History: Current Place of Residence: [place],   Scientist, research (physical sciences) of Birth: [place]    Family Members: "I have a girl-friend and a daughter"  Marital Status:  Single  Children: 1  Sons: 0  Daughters: 1  Relationships: "I have a girlfriend"  Education:  Special educational needs teacher Problems/Performance: None reported  Religious Beliefs/Practices: None reported  History of Abuse (Emotional/Phsycial/Sexual): None reported  Occupational Experiences: None reported  Military History:  None.  Legal History: None reported  Hobbies/Interests: None reported  Family History:  History reviewed. No pertinent family history.  Mental Status  Examination/Evaluation: Objective:  Appearance: Casual and Multiple body tatoos to arm areas, back of fingers.  Eye Contact::  Good  Speech:  Clear and Coherent  Volume:  Normal  Mood:  Irritable  Affect:  Flat  Thought Process:  Coherent and Intact  Orientation:  Full  Thought Content:  Rumination  Suicidal Thoughts:  No  Homicidal Thoughts:  No  Memory:  Immediate;   Good Recent;   Good Remote;   Good  Judgement:  Poor  Insight:  Fair  Psychomotor Activity:  "I'm a little restless".  Concentration:  Fair  Recall:  Good  Akathisia:  No  Handed:  Right  AIMS (if indicated):     Assets:  Desire for Improvement  Sleep:  Number of Hours: 2     Laboratory/X-Ray: None Psychological Evaluation(s)      Assessment:    AXIS I:  Polysubstance abuse and dependency, Bipolar affective disorder AXIS II:  Antisocial Personality Disorder AXIS III:   Past Medical History  Diagnosis Date  . Seizures   . Depression   . Sociopathic personality disorder    AXIS IV:  other psychosocial or environmental problems AXIS V:  Serious polysubstance abuse issues and dependency  Treatment Plan/Recommendations: Admit for safety and stabilization. Review and reinstate any pertinent home medications for other health issues. Initiate clonidine protocols for opiate withdrawal. Initiate Tegretol 200 mg bid for mood stabilization. Obtain tegretol levels on 06/14/12.   Treatment Plan Summary: Daily contact with patient to assess and evaluate symptoms and progress in treatment Medication management  Current Medications:  Current Facility-Administered Medications  Medication Dose Route Frequency Provider Last Rate Last Dose  . acetaminophen (TYLENOL) tablet 650 mg  650 mg Oral Q6H PRN Sanjuana Kava, NP   650 mg at 06/10/12 0752  . alum & mag hydroxide-simeth (MAALOX/MYLANTA) 200-200-20 MG/5ML suspension 30 mL  30 mL Oral Q4H PRN Sanjuana Kava, NP      . carbamazepine (TEGRETOL) tablet 200 mg  200  mg Oral BID Sanjuana Kava, NP      . chlordiazePOXIDE (LIBRIUM) capsule 25 mg  25 mg Oral Q6H PRN Sanjuana Kava, NP      . chlordiazePOXIDE (LIBRIUM) capsule 25 mg  25 mg Oral QID Sanjuana Kava, NP   25 mg at 06/10/12 1156   Followed by  . chlordiazePOXIDE (LIBRIUM) capsule 25 mg  25 mg Oral TID Sanjuana Kava, NP       Followed by  . chlordiazePOXIDE (LIBRIUM) capsule 25 mg  25 mg Oral BH-qamhs Sanjuana Kava, NP       Followed by  . chlordiazePOXIDE (LIBRIUM) capsule 25 mg  25 mg Oral Daily Sanjuana Kava, NP      . cloNIDine (CATAPRES) tablet 0.1 mg  0.1 mg Oral QID Nicole Kindred  I Nwoko, NP       Followed by  . cloNIDine (CATAPRES) tablet 0.1 mg  0.1 mg Oral BH-qamhs Sanjuana Kava, NP       Followed by  . cloNIDine (CATAPRES) tablet 0.1 mg  0.1 mg Oral QAC breakfast Sanjuana Kava, NP      . dicyclomine (BENTYL) tablet 20 mg  20 mg Oral Q6H PRN Sanjuana Kava, NP      . hydrOXYzine (ATARAX/VISTARIL) tablet 25 mg  25 mg Oral Q6H PRN Sanjuana Kava, NP      . hydrOXYzine (ATARAX/VISTARIL) tablet 25 mg  25 mg Oral Q6H PRN Sanjuana Kava, NP      . loperamide (IMODIUM) capsule 2-4 mg  2-4 mg Oral PRN Sanjuana Kava, NP      . loperamide (IMODIUM) capsule 2-4 mg  2-4 mg Oral PRN Sanjuana Kava, NP      . magnesium hydroxide (MILK OF MAGNESIA) suspension 30 mL  30 mL Oral Daily PRN Sanjuana Kava, NP      . methocarbamol (ROBAXIN) tablet 500 mg  500 mg Oral Q8H PRN Sanjuana Kava, NP      . multivitamin with minerals tablet 1 tablet  1 tablet Oral Daily Sanjuana Kava, NP   1 tablet at 06/10/12 4696  . naproxen (NAPROSYN) tablet 500 mg  500 mg Oral BID PRN Sanjuana Kava, NP      . nicotine (NICODERM CQ - dosed in mg/24 hours) patch 21 mg  21 mg Transdermal q morning - 10a Mike Craze, MD   21 mg at 06/10/12 1308  . ondansetron (ZOFRAN-ODT) disintegrating tablet 4 mg  4 mg Oral Q6H PRN Sanjuana Kava, NP      . ondansetron (ZOFRAN-ODT) disintegrating tablet 4 mg  4 mg Oral Q6H PRN Sanjuana Kava, NP      .  sertraline (ZOLOFT) tablet 150 mg  150 mg Oral Daily Alyson Kuroski-Mazzei, DO      . thiamine (B-1) injection 100 mg  100 mg Intramuscular Once Sanjuana Kava, NP      . thiamine (VITAMIN B-1) tablet 100 mg  100 mg Oral Daily Sanjuana Kava, NP      . DISCONTD: sertraline (ZOLOFT) tablet 150 mg  150 mg Oral Daily Sanjuana Kava, NP   150 mg at 06/10/12 2952   Facility-Administered Medications Ordered in Other Encounters  Medication Dose Route Frequency Provider Last Rate Last Dose  . DISCONTD: acetaminophen (TYLENOL) tablet 650 mg  650 mg Oral Q4H PRN Grant Fontana, PA-C      . DISCONTD: alum & mag hydroxide-simeth (MAALOX/MYLANTA) 200-200-20 MG/5ML suspension 30 mL  30 mL Oral PRN Grant Fontana, PA-C      . DISCONTD: LORazepam (ATIVAN) tablet 1 mg  1 mg Oral Q8H PRN Grant Fontana, PA-C      . DISCONTD: nicotine (NICODERM CQ - dosed in mg/24 hours) patch 21 mg  21 mg Transdermal Daily Grant Fontana, PA-C   21 mg at 06/09/12 1239  . DISCONTD: ondansetron (ZOFRAN) tablet 4 mg  4 mg Oral Q8H PRN Grant Fontana, PA-C        Observation Level/Precautions:  Q 15 minute checks for safety  Laboratory:  Per ED lab reports, (+) Barbiturates.   Psychotherapy:  Group  Medications: See medication lists    Routine PRN Medications:  Yes  Consultations:  None indicated at this time.  Discharge Concerns:  Safety and staying sober  Other:     Sanjuana Kava 7/30/20131:56 PM

## 2012-06-10 NOTE — Discharge Planning (Signed)
Pt was seen this morning during discharge planning group.  Pt rated his depression at a two and his anxiety at a seven.  Pt stated that his girlfriend called the police on him due to him coming home in a drug induced state from heroin.  The police transported him to the hospital.  Pt stated that he would like to attend a residential drug treatment program.  Upon discharge pt will live with his parents.  Pt does have transportation and he can afford his medications.  Pt was going to [facility] for outpatient services before coming to the hospital.  Pt denies SI/HI/AVH.

## 2012-06-10 NOTE — Progress Notes (Signed)
Patient ID: Caleb Rowe, male   DOB: 10-Jan-1986, 26 y.o.   MRN: [id number] Patient reports poor sleep and good appetite.  His energy level is low and he denies thoughts of hurting self or others.  He has been attending all groups and and interacting with peers.  He has been experiencing abdominal cramping and aching.  He is pleasant , outgoing, and rates his anxiety a 4.

## 2012-06-10 NOTE — Progress Notes (Signed)
Psychoeducational Group Note  Date:  06/10/2012 Time:  1100  Group Topic/Focus:  Recovery Goals:   The focus of this group is to identify appropriate goals for recovery and establish a plan to achieve them.  Participation Level:  Active  Participation Quality:  Appropriate, Attentive and Sharing  Affect:  Appropriate  Cognitive:  Alert and Appropriate  Insight:  Good  Engagement in Group:  Good  Additional Comments:  Pt was very appropriate and attentive while attending group. Pt was willing to share that his main goal was to obtain long-term treatment so that  He could stay off of drugs for his gf and daughter.   Sharyn Lull 06/10/2012, 1:42 PM

## 2012-06-10 NOTE — [facility] Suicide Risk Assessment (Signed)
Suicide Risk Assessment  Admission Assessment     Demographic factors:  See chart.  Current Mental Status:  Patient seen and evaluated. Chart reviewed. Patient stated that his mood was "fair". His affect was mood congruent and euthymic. He denied any current thoughts of self injurious behavior, suicidal ideation or homicidal ideation. He denied any significant depressive signs or symptoms at this time. There were no auditory or visual hallucinations, paranoia, delusional thought processes, or mania noted.  Thought process was linear and goal directed.  No psychomotor agitation or retardation was noted. His speech was normal rate, tone and volume. Eye contact was good. Judgment and insight are fair.  Patient has been up and engaged on the unit.  No acute safety concerns reported from team.  Loss Factors: Loss of significant relationship; issues with current GF  Historical Factors: longstanding hx opioid; no past residential Tx; employed; vague SI w/o plan in past  Risk Reduction Factors: Responsible for children under 61 years of age;Sense of responsibility to family;Employed;Living with another person, especially a relative;Positive social support;Positive therapeutic relationship; 2 children; GF; open residential Tx  CLINICAL FACTORS: Opioid Use & W/D Disorder; Mood Disorder Unspecified; r/o PD NOS with Cluster B Traits   COGNITIVE FEATURES THAT CONTRIBUTE TO RISK: limited insight; impulsivity.  SUICIDE RISK: Pt viewed as a chronic moderate increased risk of harm to self in light of his past hx and risk factors.  No acute safety concerns on the unit.  Pt contracting for safety and in need of crisis stabilization & Tx.  PLAN OF CARE: Pt admitted for crisis stabilization, detox off opiates and treatment.  Please see orders.  Pt agreed to continue his SSRI and start mood stabilizer for mood stability.  Medications reviewed with pt and medication education provided.  Will continue q15 minute checks  per unit protocol.  No clinical indication for one on one level of observation at this time.  Pt contracting for safety.  Mental health treatment, medication management and continued sobriety will mitigate against the increased risk of harm to self and/or others.  Discussed the importance of recovery with pt, as well as, tools to move forward in a healthy & safe manner.  Pt agreeable with the plan.  Discussed with the team. Pt open to residential Tx s/p detox.   Lupe Carney 06/10/2012, 3:58 PM

## 2012-06-10 NOTE — Progress Notes (Signed)
[facility] Group Notes: (Counselor/Nursing/MHT/Case Management/Adjunct) 06/10/2012   @  11:00am  Finding Balance in Life  Type of Therapy:  Group Therapy  Participation Level:  Active  Participation Quality:   Appropriate, Sharing   Affect:  Appropriate  Cognitive:  Appropriate  Insight:  Limited  Engagement in Group: Good  Engagement in Therapy:  Good  Modes of Intervention:  Support and Exploration  Summary of Progress/Problems: Caleb Rowe explored areas of his life that are out of balance. He stated that his relationships are suffering because he lies all the time, and has since childhood. He was insightful about needing therapy and to make changes in his life, as he understands that no medication will make him behonest. 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 He stated that he does not want to hurt his family members, but that he also knows that if it comes down to hurting them by lying or being honest and him being unhappy, he will lie without thinking about it. He expressed a lot of guilt over the things he has done to his family and identified that he uses drugs to escape the guilt. Caleb Rowe also stated that he gets frustrated when girlfriend calls him selfish because he pays the bills, but was able to work through this and realize that she is not talking about selfish with his money, but about him not giving her his time and not being a positive role model for daughter or a healthy partner for her. He was able to recognize that he only does what will help him get what he wants and does not think about the feelings or needs of others.   [name] 06/10/2012   3:02 PM

## 2012-06-10 NOTE — Progress Notes (Signed)
[facility] Group Notes:  (Counselor/Nursing/MHT/Case Management/Adjunct)  06/10/2012 11:39 PM  Type of Therapy:  Psychoeducational Skills  Participation Level:  Active  Participation Quality:  Appropriate  Affect:  Appropriate  Cognitive:  Appropriate  Insight:  Good  Engagement in Group:  Good  Engagement in Therapy:  Good  Modes of Intervention:  Problem-solving  Summary of Progress/Problems:Pt was very insightful and shared what his goal was,-working on placement after leaving. Pt wanted to work on his anger and addiction while in treatment here. Pt was receptive to positive feedback.   Caitlin Ainley L 06/10/2012, 11:39 PM

## 2012-06-10 NOTE — Progress Notes (Signed)
D: Pt denies SI/HI/AV. Pt is pleasant and cooperative. Pt rates depression at a 0 and Helplessness/hopelessness at a 0. Pt attends groups and actively participates. Pt did have a few complaints of nausea and joint pain.  A: Pt was offered support and encouragement. Pt was given scheduled medications. Pt was encourage to attend groups. Q 15 minute checks were done for safety.  R:Pt interacts well with peers and staff. Pt is taking medication. .Pt receptive to treatment and safety maintained on unit.

## 2012-06-10 NOTE — ED Notes (Signed)
Accepted at behavioral health  Vida Roller, MD 06/10/12 [id number]
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
## 2012-06-10 NOTE — Tx Team (Signed)
Initial Interdisciplinary Treatment Plan  PATIENT STRENGTHS: (choose at least two) Ability for insight Active sense of humor Average or above average intelligence Capable of independent living Communication skills Financial means General fund of knowledge Motivation for treatment/growth Supportive family/friends Work skills  PATIENT STRESSORS: Loss of relationship with girlfriend* Marital or family conflict Substance abuse   PROBLEM LIST: Problem List/Patient Goals Date to be addressed Date deferred Reason deferred Estimated date of resolution  Substance Abuse 06/10/12     Depression 06/10/12                                                DISCHARGE CRITERIA:  Improved stabilization in mood, thinking, and/or behavior Motivation to continue treatment in a less acute level of care Need for constant or close observation no longer present Verbal commitment to aftercare and medication compliance Withdrawal symptoms are absent or subacute and managed without 24-hour nursing intervention  PRELIMINARY DISCHARGE PLAN: Attend aftercare/continuing care group Outpatient therapy Return to previous living arrangement Return to previous work or school arrangements  PATIENT/FAMIILY INVOLVEMENT: This treatment plan has been presented to and reviewed with the patient, EZEKIAL ARNS, and/or family member.  The patient and family have been given the opportunity to ask questions and make suggestions.  Renaee Munda 06/10/2012, 3:50 AM

## 2012-06-11 DIAGNOSIS — F1120 Opioid dependence, uncomplicated: Secondary | ICD-10-CM

## 2012-06-11 DIAGNOSIS — F3130 Bipolar disorder, current episode depressed, mild or moderate severity, unspecified: Principal | ICD-10-CM

## 2012-06-11 MED ORDER — MELOXICAM 7.5 MG PO TABS
7.5000 mg | ORAL_TABLET | Freq: Every day | ORAL | Status: DC
  Administered 2012-06-11 – 2012-06-12 (×2): 7.5 mg via ORAL
  Filled 2012-06-11 (×5): qty 1

## 2012-06-11 MED ORDER — AMITRIPTYLINE HCL 25 MG PO TABS
25.0000 mg | ORAL_TABLET | Freq: Every day | ORAL | Status: DC
  Administered 2012-06-11: 25 mg via ORAL
  Filled 2012-06-11 (×4): qty 1

## 2012-06-11 MED ORDER — METHOCARBAMOL 500 MG PO TABS
500.0000 mg | ORAL_TABLET | Freq: Three times a day (TID) | ORAL | Status: DC
  Administered 2012-06-11 – 2012-06-12 (×2): 500 mg via ORAL
  Filled 2012-06-11 (×9): qty 1

## 2012-06-11 MED ORDER — PANTOPRAZOLE SODIUM 20 MG PO TBEC
20.0000 mg | DELAYED_RELEASE_TABLET | Freq: Two times a day (BID) | ORAL | Status: DC
  Administered 2012-06-12: 20 mg via ORAL
  Filled 2012-06-11 (×6): qty 1

## 2012-06-11 NOTE — Progress Notes (Signed)
D: Pt in bed resting with eyes closed. Respirations even and unlabored. Pt appears to be in no signs of distress at this time. A: Q15min checks remains for this pt. R: Pt remains safe at this time.   

## 2012-06-11 NOTE — Progress Notes (Signed)
Pt belongings were removed from locker #3 (belt, wallet, and shoes) and sent home with pt's girlfriend.

## 2012-06-11 NOTE — Progress Notes (Signed)
Patient ID: Caleb Rowe, male   DOB: 07-30-1986, 26 y.o.   MRN: [id number] D: Pt. Reports woke up this morning feeling good, "nurses been very helpful and groups" "I try to go to every group, but feel NA/AA would be more helpful." Pt. Reports aware of addictive personality "I'm from an [place] background and my family drank a lot", "I was on a boat load of things" "You'd be surprised what's out there on the street" Pt. Reports even suboxone is on the street. Staff will monitor for safety q92min. A: Pt. Is safe on the unit. Pt. Attended AA/NA group. R: Writer reviewed s/s of detox and noted that medications, therapy and alternative methods will help with symptoms i.e. Heat packs, showers.

## 2012-06-11 NOTE — H&P (Signed)
Medical/psychiatric screening examination/treatment/procedure(s) were performed by non-physician practitioner and as supervising physician I was immediately available for consultation/collaboration.  I have seen and examined this patient and agree the major elements of this evaluation.  

## 2012-06-11 NOTE — Progress Notes (Signed)
D:  Pt. about on the unit today.  He present with depressed mood but he does brighten on occasion and jokes with staff.  He is reports that he slept well last pm but has low energy level.  He denies SI/HI.  He is noted to attend groups.  He says he has experienced withdrawal symptoms of agitation, some diarrhea, and chilling in past 24 hours per self inventory and verbialization to RN.   Says that he plans on not using drugs when he is discharged.  A:  Offered support and encouragement to pt. Given prn medication for headache with stated relief .  Given scheduled meds as prescribed.  R:  Receptive to staff.  Remains on q 15 minute checks for safety.  Will continue to monitor.

## 2012-06-11 NOTE — Progress Notes (Signed)
06/11/2012         Time: 1500      Group Topic/Focus: The focus of this group is on promoting emotional and psychological well-being through the process of creative expression, relaxation, socialization, fun and enjoyment.  Participation Level: Minimal  Participation Quality: Inattentive  Affect: Excited  Cognitive: Oriented   Additional Comments: Patient not engaged in group, spent much of group talking with his peer or laughing at his roommate.   Latonja Bobeck 06/11/2012 4:03 PM

## 2012-06-11 NOTE — Progress Notes (Signed)
[facility] Group Notes:  (Counselor/Nursing/MHT/Case Management/Adjunct)  06/11/2012 1:44 PM  Type of Therapy:  Psychoeducational Skills  Participation Level:  Active  Participation Quality:  Appropriate, Attentive, Sharing and Supportive  Affect:  Appropriate  Cognitive:  Appropriate and Oriented  Insight:  Good  Engagement in Group:  Good  Engagement in Therapy:  n/a  Modes of Intervention:  Activity, Education, Problem-solving, Socialization and Support  Summary of Progress/Problems: Psychologist, clinical attended Psychoeducational group on labels. 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 [place] was active but distracted by side talk at times while group discussed what labels are, how we use them, and listed positive and negative labels they have used or been called. [place] was given a homework assignment to list 10 words he has been labeled to find the reality of the situation/label.    Wandra Scot 06/11/2012, 1:44 PM

## 2012-06-11 NOTE — Progress Notes (Addendum)
[facility] MD Progress Note  06/11/2012 5:22 PM  Diagnosis:   Axis I: Bipolar, Depressed and Opiate Dependence Axis II: Antisocial Personality Disorder Axis III:  Past Medical History  Diagnosis Date  . Seizures   . Depression   . Sociopathic personality disorder    Axis IV: other psychosocial or environmental problems and problems related to social environment Axis V: 41-50 serious symptoms  ADL's:  Intact  Sleep: Fair  Appetite:  Good  Suicidal Ideation:  Plan:  No Intent:  No Means:  No Homicidal Ideation:  Plan:  No Intent:  No Means:  No  AEB (as evidenced by): patient report  Mental Status Examination/Evaluation: Objective:  Appearance: Casual  Eye Contact::  Good  Speech:  Clear and Coherent  Volume:  Normal  Mood:  Anxious, Hopeless and Irritable  Affect:  Congruent  Thought Process:  Coherent  Orientation:  Full  Thought Content:  WDL  Suicidal Thoughts:  No  Homicidal Thoughts:  No  Memory:  Immediate;   Fair Recent;   Fair Remote;   Fair  Judgement:  Impaired  Insight:  Lacking  Psychomotor Activity:  Normal  Concentration:  Fair  Recall:  Fair  Akathisia:  No  Handed:  Right  AIMS (if indicated):     Assets:  Communication Skills Desire for Improvement  Sleep:  Number of Hours: 6.75    Vital Signs:Blood pressure 90/60, pulse 85, temperature 98.3 F (36.8 C), temperature source Oral, resp. rate 18, height 5\' 10"  (1.778 m), weight 66.679 kg (147 lb). Current Medications: Current Facility-Administered Medications  Medication Dose Route Frequency Provider Last Rate Last Dose  . acetaminophen (TYLENOL) tablet 650 mg  650 mg Oral Q6H PRN Sanjuana Kava, NP   650 mg at 06/11/12 1206  . alum & mag hydroxide-simeth (MAALOX/MYLANTA) 200-200-20 MG/5ML suspension 30 mL  30 mL Oral Q4H PRN Sanjuana Kava, NP      . carbamazepine (TEGRETOL) tablet 200 mg  200 mg Oral BID Sanjuana Kava, NP   200 mg at 06/11/12 0757  . cloNIDine (CATAPRES) tablet 0.1 mg  0.1 mg  Oral QID Sanjuana Kava, NP   0.1 mg at 06/11/12 1711   Followed by  . cloNIDine (CATAPRES) tablet 0.1 mg  0.1 mg Oral BH-qamhs Sanjuana Kava, NP       Followed by  . cloNIDine (CATAPRES) tablet 0.1 mg  0.1 mg Oral QAC breakfast Sanjuana Kava, NP      . multivitamin with minerals tablet 1 tablet  1 tablet Oral Daily Sanjuana Kava, NP   1 tablet at 06/11/12 0756  . nicotine (NICODERM CQ - dosed in mg/24 hours) patch 21 mg  21 mg Transdermal q morning - 10a Mike Craze, MD   21 mg at 06/11/12 0347  . sertraline (ZOLOFT) tablet 150 mg  150 mg Oral Daily Alyson Kuroski-Mazzei, DO   150 mg at 06/11/12 0756  . thiamine (B-1) injection 100 mg  100 mg Intramuscular Once Sanjuana Kava, NP      . thiamine (VITAMIN B-1) tablet 100 mg  100 mg Oral Daily Sanjuana Kava, NP   100 mg at 06/11/12 0756    Lab Results: No results found for this or any previous visit (from the past 48 hour(s)).  Physical Findings: AIMS: Facial and Oral Movements Muscles of Facial Expression: None, normal Lips and Perioral Area: None, normal Jaw: None, normal Tongue: None, normal,Extremity Movements Upper (arms, wrists, hands, fingers): None, normal  Lower (legs, knees, ankles, toes): None, normal, Trunk Movements Neck, shoulders, hips: None, normal, Overall Severity Severity of abnormal movements (highest score from questions above): None, normal Incapacitation due to abnormal movements: None, normal Patient's awareness of abnormal movements (rate only patient's report): No Awareness, Dental Status Current problems with teeth and/or dentures?: No Does patient usually wear dentures?: No  CIWA:  CIWA-Ar Total: 3  COWS:  COWS Total Score: 5   Treatment Plan Summary: Daily contact with patient to assess and evaluate symptoms and progress in treatment Medication management No signs/symptoms of withdrawal from abusable substances.  Plan: Continue current treatment plan plus go to the [facility] 12 Step Program. Add  Robaxin for back spasms, Elavil for pain management and insomnia, Mobic for inflammation and Protonix for stomach protection.  Karter Hellmer 06/11/2012, 5:22 PM

## 2012-06-11 NOTE — Progress Notes (Signed)
Psychoeducational Group Note  Date:  06/11/2012 Time:  1100  Group Topic/Focus:  Personal Choices and Values:   The focus of this group is to help patients assess and explore the importance of values in their lives, how their values affect their decisions, how they express their values and what opposes their expression.  Participation Level:  Active  Participation Quality:  Appropriate, Attentive, Sharing and Supportive  Affect:  Appropriate  Cognitive:  Alert and Appropriate  Insight:  Good  Engagement in Group:  Good  Additional Comments:  Pt was very appropriate and sharing while attending group. Pt shared that his top values were making money and having it easy. Pt stated that having easy is being able to get along with his girlfriend and having his baby girl behave while riding in the backseat of car.   Sharyn Lull 06/11/2012, 3:16 PM

## 2012-06-11 NOTE — Progress Notes (Signed)
Pt attended discharge planning group and actively participated in group.  SW provided pt with today's workbook.  Pt presents with calm mood and affect.  Pt states that he was using heroin, pain pills and any pills he can get a hold of.  Pt states that he has been using for 4 years.  Pt states that he wants to go to rehab after detox here.  Pt states that he has a few applications but wants more information on the facilities.  SW will make appropriate referrals.  Pt states that his girlfriend is also helping him with referrals and is supportive. Pt states that she doesn't use.  Pt states that he quit his job to come here to get help.  Pt denies having depression and anxiety.  Pt denies SI.  No further needs voiced by pt at this time.  Safety planning and suicide prevention discussed.  Pt participated in discussion and acknowledged an understanding of the information provided.       Reyes Ivan, LCSWA 06/11/2012  11:09 AM

## 2012-06-12 ENCOUNTER — Emergency Department (HOSPITAL_COMMUNITY)
Admission: EM | Admit: 2012-06-12 | Discharge: 2012-06-14 | Disposition: A | Discharge status: Home / Self Care | Payer: BC Managed Care – PPO | Attending: Emergency Medicine | Admitting: Emergency Medicine

## 2012-06-12 ENCOUNTER — Encounter (HOSPITAL_COMMUNITY): Payer: Self-pay | Admitting: Family Medicine

## 2012-06-12 DIAGNOSIS — F1120 Opioid dependence, uncomplicated: Secondary | ICD-10-CM

## 2012-06-12 DIAGNOSIS — F329 Major depressive disorder, single episode, unspecified: Secondary | ICD-10-CM

## 2012-06-12 DIAGNOSIS — F3289 Other specified depressive episodes: Secondary | ICD-10-CM | POA: Insufficient documentation

## 2012-06-12 DIAGNOSIS — F17200 Nicotine dependence, unspecified, uncomplicated: Secondary | ICD-10-CM | POA: Insufficient documentation

## 2012-06-12 DIAGNOSIS — F1110 Opioid abuse, uncomplicated: Secondary | ICD-10-CM | POA: Insufficient documentation

## 2012-06-12 DIAGNOSIS — Z79899 Other long term (current) drug therapy: Secondary | ICD-10-CM | POA: Insufficient documentation

## 2012-06-12 LAB — COMPREHENSIVE METABOLIC PANEL
ALT: 12 U/L (ref 0–53)
CO2: 31 mEq/L (ref 19–32)
Calcium: 9.3 mg/dL (ref 8.4–10.5)
Creatinine, Ser: 1.17 mg/dL (ref 0.50–1.35)
GFR calc Af Amer: >90 mL/min (ref >90)
GFR calc non Af Amer: 84 mL/min — ABNORMAL LOW (ref >90)
Glucose, Bld: 92 mg/dL (ref 70–99)
Sodium: 138 mEq/L (ref 135–145)

## 2012-06-12 LAB — COMPREHENSIVE METABOLIC PANEL WITH GFR
AST: 17 U/L (ref 0–37)
Albumin: 4.2 g/dL (ref 3.5–5.2)
Alkaline Phosphatase: 58 U/L (ref 39–117)
BUN: 15 mg/dL (ref 6–23)
Chloride: 99 meq/L (ref 96–112)
Potassium: 3.7 meq/L (ref 3.5–5.1)
Total Bilirubin: 0.2 mg/dL — ABNORMAL LOW (ref 0.3–1.2)
Total Protein: 6.7 g/dL (ref 6.0–8.3)

## 2012-06-12 LAB — CBC
HCT: 39.4 % (ref 39.0–52.0)
Hemoglobin: 13.6 g/dL (ref 13.0–17.0)
MCH: 29.7 pg (ref 26.0–34.0)
MCHC: 34.5 g/dL (ref 30.0–36.0)
MCV: 86 fL (ref 78.0–100.0)
Platelets: 195 K/uL (ref 150–400)
RBC: 4.58 MIL/uL (ref 4.22–5.81)
RDW: 12.2 % (ref 11.5–15.5)
WBC: 8.5 K/uL (ref 4.0–10.5)

## 2012-06-12 LAB — DRUGS OF ABUSE SCREEN W/O ALC, ROUTINE URINE
Amphetamine Screen, Ur: NEGATIVE
Benzodiazepines.: POSITIVE — AB
Methadone: NEGATIVE
Phencyclidine (PCP): NEGATIVE
Propoxyphene: NEGATIVE

## 2012-06-12 LAB — URINALYSIS, ROUTINE W REFLEX MICROSCOPIC
Bilirubin Urine: NEGATIVE
Glucose, UA: NEGATIVE mg/dL
Hgb urine dipstick: NEGATIVE
Ketones, ur: NEGATIVE mg/dL
Leukocytes, UA: NEGATIVE
Nitrite: NEGATIVE
Protein, ur: NEGATIVE mg/dL
Specific Gravity, Urine: 1.025 (ref 1.005–1.030)
Urobilinogen, UA: 0.2 mg/dL (ref 0.0–1.0)
pH: 6 (ref 5.0–8.0)

## 2012-06-12 LAB — RAPID URINE DRUG SCREEN, HOSP PERFORMED
Amphetamines: NOT DETECTED
Barbiturates: POSITIVE — AB
Benzodiazepines: POSITIVE — AB
Cocaine: NOT DETECTED
Opiates: NOT DETECTED
Tetrahydrocannabinol: NOT DETECTED

## 2012-06-12 LAB — SALICYLATE LEVEL: Salicylate Lvl: <2 mg/dL — ABNORMAL LOW (ref 2.8–20.0)

## 2012-06-12 LAB — ACETAMINOPHEN LEVEL: Acetaminophen (Tylenol), Serum: <15 ug/mL (ref 10–30)

## 2012-06-12 LAB — ETHANOL: Alcohol, Ethyl (B): <11 mg/dL (ref 0–11)

## 2012-06-12 MED ORDER — LOPERAMIDE HCL 2 MG PO CAPS
2.0000 mg | ORAL_CAPSULE | ORAL | Status: DC | PRN
  Administered 2012-06-13: 2 mg via ORAL
  Filled 2012-06-12: qty 1

## 2012-06-12 MED ORDER — SERTRALINE HCL 100 MG PO TABS: 150.0000 mg | ORAL_TABLET | Freq: Every day | ORAL | Status: DC

## 2012-06-12 MED ORDER — CLONIDINE HCL 0.1 MG PO TABS
0.1000 mg | ORAL_TABLET | Freq: Four times a day (QID) | ORAL | Status: DC
  Administered 2012-06-12 – 2012-06-14 (×7): 0.1 mg via ORAL
  Filled 2012-06-12 (×8): qty 1

## 2012-06-12 MED ORDER — CLONIDINE HCL 0.1 MG PO TABS: 0.1000 mg | ORAL_TABLET | ORAL | Status: DC

## 2012-06-12 MED ORDER — ACETAMINOPHEN 325 MG PO TABS
650.0000 mg | ORAL_TABLET | ORAL | Status: DC | PRN
  Administered 2012-06-14 (×2): 650 mg via ORAL
  Filled 2012-06-12 (×2): qty 2

## 2012-06-12 MED ORDER — DICYCLOMINE HCL 20 MG PO TABS
20.0000 mg | ORAL_TABLET | Freq: Four times a day (QID) | ORAL | Status: DC | PRN
  Administered 2012-06-13 – 2012-06-14 (×2): 20 mg via ORAL
  Filled 2012-06-12 (×2): qty 1

## 2012-06-12 MED ORDER — ONDANSETRON HCL 4 MG PO TABS
4.0000 mg | ORAL_TABLET | Freq: Three times a day (TID) | ORAL | Status: DC | PRN
  Administered 2012-06-12: 4 mg via ORAL
  Filled 2012-06-12: qty 1

## 2012-06-12 MED ORDER — AMITRIPTYLINE HCL 25 MG PO TABS: 25.0000 mg | ORAL_TABLET | Freq: Every day | ORAL | Status: DC

## 2012-06-12 MED ORDER — ZOLPIDEM TARTRATE 5 MG PO TABS: 5.0000 mg | ORAL_TABLET | Freq: Every evening | ORAL | Status: DC | PRN

## 2012-06-12 MED ORDER — ALUM & MAG HYDROXIDE-SIMETH 200-200-20 MG/5ML PO SUSP: 30.0000 mL | ORAL | Status: DC | PRN

## 2012-06-12 MED ORDER — IBUPROFEN 600 MG PO TABS: 600.0000 mg | ORAL_TABLET | Freq: Three times a day (TID) | ORAL | Status: DC | PRN

## 2012-06-12 MED ORDER — CLONIDINE HCL 0.1 MG PO TABS: 0.1000 mg | ORAL_TABLET | Freq: Every day | ORAL | Status: DC

## 2012-06-12 MED ORDER — PANTOPRAZOLE SODIUM 20 MG PO TBEC: 20.0000 mg | DELAYED_RELEASE_TABLET | Freq: Two times a day (BID) | ORAL | Status: DC

## 2012-06-12 MED ORDER — LORAZEPAM 1 MG PO TABS
1.0000 mg | ORAL_TABLET | Freq: Three times a day (TID) | ORAL | Status: DC | PRN
  Administered 2012-06-13 – 2012-06-14 (×2): 1 mg via ORAL
  Filled 2012-06-12 (×3): qty 1

## 2012-06-12 MED ORDER — HYDROXYZINE HCL 25 MG PO TABS
25.0000 mg | ORAL_TABLET | Freq: Four times a day (QID) | ORAL | Status: DC | PRN
  Administered 2012-06-14: 25 mg via ORAL
  Filled 2012-06-12: qty 1

## 2012-06-12 MED ORDER — CLONIDINE HCL 0.1 MG PO TABS: ORAL_TABLET | ORAL | Status: DC

## 2012-06-12 MED ORDER — CARBAMAZEPINE 200 MG PO TABS: 200.0000 mg | ORAL_TABLET | Freq: Two times a day (BID) | ORAL | Status: DC

## 2012-06-12 MED ORDER — NICOTINE 21 MG/24HR TD PT24: 1.0000 | MEDICATED_PATCH | Freq: Every morning | TRANSDERMAL | Status: AC

## 2012-06-12 MED ORDER — METHOCARBAMOL 500 MG PO TABS
500.0000 mg | ORAL_TABLET | Freq: Three times a day (TID) | ORAL | Status: DC | PRN
  Administered 2012-06-13: 500 mg via ORAL
  Filled 2012-06-12: qty 1

## 2012-06-12 MED ORDER — METHOCARBAMOL 500 MG PO TABS: 500.0000 mg | ORAL_TABLET | Freq: Three times a day (TID) | ORAL | Status: AC

## 2012-06-12 MED ORDER — MELOXICAM 7.5 MG PO TABS: 7.5000 mg | ORAL_TABLET | Freq: Every day | ORAL | Status: AC

## 2012-06-12 NOTE — Tx Team (Signed)
Interdisciplinary Treatment Plan Update (Adult)  Date:  06/12/2012  Time Reviewed:  11:59 AM   Progress in Treatment: Attending groups: Yes Participating in groups:  Yes Taking medication as prescribed: Yes Tolerating medication:  Yes Family/Significant other contact made:  Counselor will make contact Patient understands diagnosis:  Yes Discussing patient identified problems/goals with staff:  Yes Medical problems stabilized or resolved:  Yes Denies suicidal/homicidal ideation: Yes Issues/concerns per patient self-inventory:  None identified Other: N/A  New problem(s) identified: Pt admits to using xanax that was brought in by another pt.  Pt also found with a lighter on him.  Discussed personal responsibility and plan moving forward.    Reason for Continuation of Hospitalization: Withdrawal symptoms Other; describe referral for further treatment  Interventions implemented related to continuation of hospitalization: mood stabilization, medication monitoring and adjustment, group therapy and psycho education, safety checks q 15 mins  Additional comments: N/A  Estimated length of stay: 1-3 days  Discharge Plan: SW is assessing for appropriate referral for further treatment.    New goal(s): N/A  Review of initial/current patient goals per problem list:   1. Goal(s): Complete detox protocol for opioid medications  Met: Yes  Target date: by discharge  As evidenced by: Caleb Rowe completed detox protocol 2. Goal (s): Decrease depressive symptoms to rating of 4 or less  Met: Yes  Target date: by discharge  As evidenced by: Caleb Rowe rates depression at a 0 today 3. Goal(s): Decrease anxiety symptoms to a rating of 4 or less  Met: Yes Target date: by discharge  As evidenced by: Caleb Rowe rates anxiety at 0 today 4. Goal(s): Medication stabilization  Met: No  Target date: by discharge  As evidenced by: Caleb Rowe is not yet reporting the his medications are working to reduce  symptoms Attendees: Patient:  Caleb Rowe 06/12/2012 12:00 PM   Family:     Physician:  Orson Aloe, MD  06/12/2012  11:59 AM   Nursing:   Celso Sickle, RN 06/12/2012 12:00 PM   Case Manager:  Reyes Ivan, LCSWA 06/12/2012  11:59 AM   Counselor:  Angus Palms, LCSW 06/12/2012  11:59 AM   Other:  Everlene Balls, asst director 06/12/2012  11:59 AM   Other:     Other:     Other:      Scribe for Treatment Team:   Carmina Miller, 06/12/2012 , 11:59 AM

## 2012-06-12 NOTE — ED Notes (Signed)
Patients states "I am here for addiction." Reports having headache, cold sweats, chills, generalized bodyaches. Withdrawing from opiates. States last used 2 days ago. Reports having SI and HI.

## 2012-06-12 NOTE — Progress Notes (Signed)
[facility] MD Progress Note  06/12/2012 2:59 PM  Diagnosis:   Axis I: Bipolar, Depressed and Opiate Dependence Axis II: Antisocial Personality Disorder Axis III:  Past Medical History  Diagnosis Date  . Seizures   . Depression   . Sociopathic personality disorder    Axis IV: other psychosocial or environmental problems and problems related to social environment Axis V: 41-50 serious symptoms  ADL's:  Intact  Sleep: Fair  Appetite:  Good  Suicidal Ideation:  Plan:  No Intent:  No Means:  No Homicidal Ideation:  Plan:  No Intent:  No Means:  No  AEB (as evidenced by): patient report  Mental Status Examination/Evaluation: Objective:  Appearance: Casual  Eye Contact::  Good  Speech:  Clear and Coherent  Volume:  Normal  Mood:  Euthymic  Affect:  Congruent  Thought Process:  Coherent  Orientation:  Full  Thought Content:  WDL  Suicidal Thoughts:  No  Homicidal Thoughts:  No  Memory:  Immediate;   Fair Recent;   Fair Remote;   Fair  Judgement:  Impaired  Insight:  Lacking  Psychomotor Activity:  Normal  Concentration:  Fair  Recall:  Fair  Akathisia:  No  Handed:  Right  AIMS (if indicated):     Assets:  Communication Skills Desire for Improvement  Sleep:  Number of Hours: 5    Vital Signs:Blood pressure 117/75, pulse 81, temperature 97.5 F (36.4 C), temperature source Oral, resp. rate 16, height 5\' 10"  (1.778 m), weight 66.679 kg (147 lb). Current Medications: Current Facility-Administered Medications  Medication Dose Route Frequency Provider Last Rate Last Dose  . acetaminophen (TYLENOL) tablet 650 mg  650 mg Oral Q6H PRN Sanjuana Kava, NP   650 mg at 06/11/12 1206  . alum & mag hydroxide-simeth (MAALOX/MYLANTA) 200-200-20 MG/5ML suspension 30 mL  30 mL Oral Q4H PRN Sanjuana Kava, NP      . amitriptyline (ELAVIL) tablet 25 mg  25 mg Oral QHS Mike Craze, MD   25 mg at 06/11/12 2321  . carbamazepine (TEGRETOL) tablet 200 mg  200 mg Oral BID Sanjuana Kava, NP    200 mg at 06/12/12 0820  . cloNIDine (CATAPRES) tablet 0.1 mg  0.1 mg Oral QID Sanjuana Kava, NP   0.1 mg at 06/11/12 2320   Followed by  . cloNIDine (CATAPRES) tablet 0.1 mg  0.1 mg Oral BH-qamhs Sanjuana Kava, NP   0.1 mg at 06/12/12 1610   Followed by  . cloNIDine (CATAPRES) tablet 0.1 mg  0.1 mg Oral QAC breakfast Sanjuana Kava, NP      . meloxicam (MOBIC) tablet 7.5 mg  7.5 mg Oral Daily Mike Craze, MD   7.5 mg at 06/12/12 0819  . methocarbamol (ROBAXIN) tablet 500 mg  500 mg Oral TID Mike Craze, MD   500 mg at 06/12/12 9604  . multivitamin with minerals tablet 1 tablet  1 tablet Oral Daily Sanjuana Kava, NP   1 tablet at 06/12/12 0820  . nicotine (NICODERM CQ - dosed in mg/24 hours) patch 21 mg  21 mg Transdermal q morning - 10a Mike Craze, MD   21 mg at 06/12/12 1000  . pantoprazole (PROTONIX) EC tablet 20 mg  20 mg Oral BID AC Mike Craze, MD   20 mg at 06/12/12 0654  . sertraline (ZOLOFT) tablet 150 mg  150 mg Oral Daily Alyson Kuroski-Mazzei, DO   150 mg at 06/12/12 0819  . thiamine (  B-1) injection 100 mg  100 mg Intramuscular Once Sanjuana Kava, NP      . thiamine (VITAMIN B-1) tablet 100 mg  100 mg Oral Daily Sanjuana Kava, NP   100 mg at 06/12/12 1308    Lab Results:  Results for orders placed during the hospital encounter of 06/10/12 (from the past 48 hour(s))  DRUGS OF ABUSE SCREEN W/O ALC, ROUTINE URINE     Status: Abnormal   Collection Time   06/12/12  6:03 AM      Component Value Range Comment   Marijuana Metabolite NEGATIVE  Negative    Amphetamine Screen, Ur NEGATIVE  Negative    Barbiturate Quant, Ur POSITIVE (*) Negative    Methadone NEGATIVE  Negative    Benzodiazepines. POSITIVE (*) Negative    Phencyclidine (PCP) NEGATIVE  Negative    Cocaine Metabolites NEGATIVE  Negative    Opiate Screen, Urine NEGATIVE  Negative    Propoxyphene NEGATIVE  Negative    Creatinine,U 87.3       Physical Findings: AIMS: Facial and Oral Movements Muscles of  Facial Expression: None, normal Lips and Perioral Area: None, normal Jaw: None, normal Tongue: None, normal,Extremity Movements Upper (arms, wrists, hands, fingers): None, normal Lower (legs, knees, ankles, toes): None, normal, Trunk Movements Neck, shoulders, hips: None, normal, Overall Severity Severity of abnormal movements (highest score from questions above): None, normal Incapacitation due to abnormal movements: None, normal Patient's awareness of abnormal movements (rate only patient's report): No Awareness, Dental Status Current problems with teeth and/or dentures?: No Does patient usually wear dentures?: No  CIWA:  CIWA-Ar Total: 1  COWS:  COWS Total Score: 5   Treatment Plan Summary: Daily contact with patient to assess and evaluate symptoms and progress in treatment Medication management No signs/symptoms of withdrawal from abusable substances.  Plan: Pt was confronted in treatment team with nurse administrator Everlene Balls present about his involvement with his roommate in smuggling into the facility and then sharing benzodiazepines while in the hospital.  The patient was not willing to fully own his having a lighter on the unit, but was able to admit that the lighter found was his.  He describes a past history of antisocial behavior.  This was discussed with him in the treatment team where he needs to get completely honest with himself in order for him to fully engage in his recovery.  He admitted that he has no resistance to temptation.  He was challenged that he needed to start making an effort with resisting temptations.  He was advised to have no further contact with his roommate that has been moved to another hall.  He was advised that he would need to voluntarily empty his pockets after meals and to not have visits with his girlfriend on the unit for at least 24 hours and then we would re look at that privilege.  He was observed on camera in the cafeteria to go directly over  to the ex roommate and receive something from him and put what he received from the ex roommate into his pocket.  The handoff was deliberate and practiced and under the nose of staff.  He is not making a sincere effort in his overt behaviors to being honest with his recovery.  It seems that to him the responsibility of any behavior is on the staff to prove that he was intending any malicious intent.  He takes no responsibility for his behavior or sees problems with his saying one thing  that he wants rehab while his behavior screams the opposite. There are rumors that there are 100 Xanax pills that have been brought into the hospital.  There is a collection of pills that appear to be Xanax bars and round pills of xanax that have been recovered today from a folded piece of paper that fell out of the pocket of the ex roommate on the [address].  Will discharge the patient as he is posing a risk to the safety of the other well intended patients on the units.  Pt denies any desire to harm himself and gives a list of reasons why he wants and needs long term rehab.  He asked in treatment team to be allowed to stay and not go out before he can get into a long term rehab facility.  He is overtly defying what staff have asked him to do and continues to put himself and others in danger by covertly exchanging items with someone who he had been warned to stay away from.  He is being informed of this latest discovery and will be discharged now.  His risk of harm self and others depends on the level of involvement that he chooses with those who are less than fully interested in helping themselves.  Johari Bennetts 06/12/2012, 2:59 PM

## 2012-06-12 NOTE — Progress Notes (Signed)
Patient discharged home today per MD order.  He will follow up with his NA meetings at home and attempt to get into a treatment facility.  He denies any SI/HI/AVH.  He attended treatment team meeting and his substance abuse was addressed.  His association with another peer on the unit that was actively using while here was also discussed.  Patient admitted to taking a half of xanax from his former roommate.  Patient's father also called after patient was discharged and stated that patient does not have a substance abuse problem; he has a "mental disorder."  Patient's father was very angry that we discharged the patient.  His father stated that he felt the patient was suicidal; I explained to him that patient denied any SI upon discharge and patient was agreeable with discharge today.  Patient's father stated he would "sue our ass" if anything happened to patient as far as self harm.  Father called back later and apologized to staff stating that he did indeed know his son had a substance abuse problem.  Patient received all belongings from locker; he waiting in lobby area for his ride.  He was calm and cooperative; very appreciative of staff.

## 2012-06-12 NOTE — Progress Notes (Signed)
Psychoeducational Group Note  Date:  06/12/2012 Time:  1000  Group Topic/Focus:  Therapeutic Activity- "Apples to Apples"  Participation Level:  Active  Participation Quality:  Appropriate, Attentive and Sharing  Affect:  Appropriate  Cognitive:  Alert and Appropriate  Insight:  Good  Engagement in Group:  Good  Additional Comments:   Pt participated in therapeutic activities, which was a card game called "Apples to Apples". Pt was appropriate and willing to interact as the game was being conducted.  

## 2012-06-12 NOTE — Progress Notes (Signed)
[facility] Group Notes: (Counselor/Nursing/MHT/Case Management/Adjunct)  06/11/2012 1:15-2:30pm  Emotion Regulation  Type of Therapy: Group Therapy   Participation Level: None   Participation Quality: Drowsy   Affect: Flat   Cognitive: Appropriate   Insight: None   Engagement in Group: None   Engagement in Therapy: None   Modes of Intervention: Support and Exploration   Summary of Progress/Problems: Zack slept through group    Angus Palms, LCSW  06/12/11 3:56pm

## 2012-06-12 NOTE — ED Provider Notes (Signed)
History     CSN: [id number]  Arrival date & time 06/12/12  2040   First MD Initiated Contact with Patient 06/12/12 2151      Chief Complaint  Patient presents with  . Medical Clearance    (Consider location/radiation/quality/duration/timing/severity/associated sxs/prior treatment) HPI Comments: Patient presents to ED after being discharged from behavioral health today.  States he has been in behavioral health since he was seen in ED on 06/09/12.  Pt was admitted for depression and opiate addiction.  States his roommate was caught with xanax and he was found to have a lighter in his pocket and he was dismissed.  States he is having withdrawal symptoms now - feeling occasional chills and sweats, body aches, yawning, general fatigue, abdominal cramping, diarrhea.  Last use was prior to last admission.  Pt uses narcotics- oxycodone, suboxone, as well as adderall and occasional heroin.  Denies ETOH or cocaine use.  Pt states that he is depressed and has passive thoughts of SI but no plan.  Is angry at roommate for what happened but no current HI.    The history is provided by the patient.    Past Medical History  Diagnosis Date  . Seizures   . Depression   . Sociopathic personality disorder     Past Surgical History  Procedure Date  . Knee surgery     No family history on file.  History  Substance Use Topics  . Smoking status: Current Everyday Smoker -- 0.2 packs/day for 10 years    Types: Cigarettes  . Smokeless tobacco: Not on file  . Alcohol Use: No     rarely      Review of Systems  Constitutional: Positive for fatigue.  Respiratory: Negative for shortness of breath.   Cardiovascular: Negative for chest pain.  Gastrointestinal: Positive for abdominal pain and diarrhea. Negative for vomiting.  Musculoskeletal: Positive for myalgias.  Neurological: Positive for headaches.    Allergies  Review of patient's allergies indicates no known allergies.  Home Medications    Current Outpatient Rx  Name Route Sig Dispense Refill  . AMITRIPTYLINE HCL 25 MG PO TABS Oral Take 1 tablet (25 mg total) by mouth at bedtime. For pain management, depression, and insomnia. 30 tablet 0  . CARBAMAZEPINE 200 MG PO TABS Oral Take 1 tablet (200 mg total) by mouth 2 (two) times daily. For mood control. 60 tablet 0  . CLONIDINE HCL 0.1 MG PO TABS  For opiate withdrawal symptoms, Take by mouth 4 a day on Aug 1 and 2, then 2 a day for Aug 3 and 4, then stop 10 tablet 0  . MELOXICAM 7.5 MG PO TABS Oral Take 1 tablet (7.5 mg total) by mouth daily. For inflammation 30 tablet 0  . METHOCARBAMOL 500 MG PO TABS Oral Take 1 tablet (500 mg total) by mouth 3 (three) times daily. For back spasms. 90 tablet 0  . NICOTINE 21 MG/24HR TD PT24 Transdermal Place 1 patch onto the skin every morning. For smoking cessation. Nicotine is a gateway drug and should be stopped. 28 patch 0  . PANTOPRAZOLE SODIUM 20 MG PO TBEC Oral Take 1 tablet (20 mg total) by mouth 2 (two) times daily before a meal. For control of stomach acid secretion and helps GERD. 30 tablet 0  . SERTRALINE HCL 100 MG PO TABS Oral Take 1.5 tablets (150 mg total) by mouth daily. For depression. Pt takes 1.5 tab for 150 mg dose 45 tablet 0    BP 117/52  Pulse 70  Temp 98.3 F (36.8 C) (Oral)  Resp 22  Ht 6' (1.829 m)  Wt 160 lb (72.576 kg)  BMI 21.70 kg/m2  SpO2 100%  Physical Exam  Nursing note and vitals reviewed. Constitutional: He appears well-developed and well-nourished. No distress.  HENT:  Head: Normocephalic and atraumatic.  Neck: Neck supple.  Cardiovascular: Normal rate and regular rhythm.   Pulmonary/Chest: Effort normal and breath sounds normal. No respiratory distress. He has no wheezes. He has no rales.  Abdominal: Soft. He exhibits no distension and no mass. There is no tenderness. There is no rebound and no guarding.  Musculoskeletal: He exhibits no edema and no tenderness.  Neurological: He is alert. He  exhibits normal muscle tone.  Skin: He is not diaphoretic.    ED Course  Procedures (including critical care time)  Labs Reviewed  URINE RAPID DRUG SCREEN (HOSP PERFORMED) - Abnormal; Notable for the following:    Benzodiazepines POSITIVE (*)     Barbiturates POSITIVE (*)     All other components within normal limits  COMPREHENSIVE METABOLIC PANEL - Abnormal; Notable for the following:    Total Bilirubin 0.2 (*)     GFR calc non Af Amer 84 (*)     All other components within normal limits  SALICYLATE LEVEL - Abnormal; Notable for the following:    Salicylate Lvl <2.0 (*)     All other components within normal limits  URINALYSIS, ROUTINE W REFLEX MICROSCOPIC  CBC  ETHANOL  ACETAMINOPHEN LEVEL   No results found.   1. Narcotic addiction   2. Depression       MDM  Pt d/c from behavioral health this morning, states he wants to continue treatment for narcotic addiction.  Reports mild withdrawal symptoms in ED.  Pt is voluntary.   I paged [facility] several times with no return call.  I spoke with secretary who contacted [facility] who indirectly told me (through Drenda Freeze, Charity fundraiser) that they knew about everyone at [facility] and would come to see them later ([facility] currently single coverage, at [facility]).   I have discussed patient with Dr Nicanor Alcon, who assumes care of patient overnight.          [place], [place] 06/13/12 [phone]

## 2012-06-12 NOTE — Progress Notes (Signed)
        [facility] Group Notes: (Counselor/Nursing/MHT/Case Management/Adjunct) 06/12/2012   @1 :15pm [facility] in [facility]  Type of Therapy:  Group Therapy  Participation Level:  Good  Participation Quality:  Inattentive  Affect:  Appropriate  Cognitive:  Appropriate  Insight:  Good  Engagement in Group:  Good  Engagement in Therapy:  Good  Modes of Intervention:  Support and Exploration  Summary of Progress/Problems: Caleb Rowe  participated with speaker from [facility] [place] and expressed interest in programs [facility] offers. He did have to be redirected from time to time for talking to the group member beside of him.   [name] 06/12/2012  3:59 PM

## 2012-06-12 NOTE — ED Notes (Signed)
Sitter at bedside. Pt given sandwhich and sprite.

## 2012-06-12 NOTE — [facility] Suicide Risk Assessment (Signed)
Suicide Risk Assessment  Discharge Assessment     Demographic factors:  Male;Caucasian    Current Mental Status Per Nursing Assessment::   On Admission:    At Discharge:     Loss Factors: Loss of significant relationship  Continued Clinical Symptoms:  Bipolar Disorder:   Bipolar II Alcohol/Substance Abuse/Dependencies Chronic Pain Previous Psychiatric Diagnoses and Treatments  Cognitive Features That Contribute To Risk:  Closed-mindedness Polarized thinking Thought constriction (tunnel vision)    Suicide Risk:  Minimal: No identifiable suicidal ideation.  Patients presenting with no risk factors but with morbid ruminations; may be classified as minimal risk based on the severity of the depressive symptoms  Discharge Diagnosis:   Axis I: Bipolar, Depressed and Opiate Dependence Axis II: Antisocial Personality Disorder Axis III:  Past Medical History  Diagnosis Date  . Seizures   . Depression   . Sociopathic personality disorder    Axis IV: other psychosocial or environmental problems and problems related to social environment Axis V: 41-50 serious symptoms  Current Mental Status per Physician ADL's:  Intact  Sleep: Fair  Appetite:  Good  Suicidal Ideation:  Plan:  No Intent:  No Means:  No Homicidal Ideation:  Plan:  No Intent:  No Means:  No  AEB (as evidenced by): patient report  Mental Status Examination/Evaluation: Objective:  Appearance: Casual  Eye Contact::  Good  Speech:  Clear and Coherent  Volume:  Normal  Mood:  Euthymic  Affect:  Congruent  Thought Process:  Coherent  Orientation:  Full  Thought Content:  WDL  Suicidal Thoughts:  No  Homicidal Thoughts:  No  Memory:  Immediate;   Fair Recent;   Fair Remote;   Fair  Judgement:  Impaired  Insight:  Lacking  Psychomotor Activity:  Normal  Concentration:  Fair  Recall:  Fair  Akathisia:  No  Handed:  Right  AIMS (if indicated):     Assets:  Communication Skills Desire for  Improvement  Sleep:  Number of Hours: 5    Vital Signs:Blood pressure 117/75, pulse 81, temperature 97.5 F (36.4 C), temperature source Oral, resp. rate 16, height 5\' 10"  (1.778 m), weight 66.679 kg (147 lb). Current Medications: Current Facility-Administered Medications  Medication Dose Route Frequency Provider Last Rate Last Dose  . acetaminophen (TYLENOL) tablet 650 mg  650 mg Oral Q6H PRN Sanjuana Kava, NP   650 mg at 06/11/12 1206  . alum & mag hydroxide-simeth (MAALOX/MYLANTA) 200-200-20 MG/5ML suspension 30 mL  30 mL Oral Q4H PRN Sanjuana Kava, NP      . amitriptyline (ELAVIL) tablet 25 mg  25 mg Oral QHS Mike Craze, MD   25 mg at 06/11/12 2321  . carbamazepine (TEGRETOL) tablet 200 mg  200 mg Oral BID Sanjuana Kava, NP   200 mg at 06/12/12 0820  . cloNIDine (CATAPRES) tablet 0.1 mg  0.1 mg Oral QID Sanjuana Kava, NP   0.1 mg at 06/11/12 2320   Followed by  . cloNIDine (CATAPRES) tablet 0.1 mg  0.1 mg Oral BH-qamhs Sanjuana Kava, NP   0.1 mg at 06/12/12 0454   Followed by  . cloNIDine (CATAPRES) tablet 0.1 mg  0.1 mg Oral QAC breakfast Sanjuana Kava, NP      . meloxicam (MOBIC) tablet 7.5 mg  7.5 mg Oral Daily Mike Craze, MD   7.5 mg at 06/12/12 0819  . methocarbamol (ROBAXIN) tablet 500 mg  500 mg Oral TID Mike Craze, MD  500 mg at 06/12/12 2536  . multivitamin with minerals tablet 1 tablet  1 tablet Oral Daily Sanjuana Kava, NP   1 tablet at 06/12/12 0820  . nicotine (NICODERM CQ - dosed in mg/24 hours) patch 21 mg  21 mg Transdermal q morning - 10a Mike Craze, MD   21 mg at 06/12/12 1000  . pantoprazole (PROTONIX) EC tablet 20 mg  20 mg Oral BID AC Mike Craze, MD   20 mg at 06/12/12 0654  . sertraline (ZOLOFT) tablet 150 mg  150 mg Oral Daily Alyson Kuroski-Mazzei, DO   150 mg at 06/12/12 0819  . thiamine (B-1) injection 100 mg  100 mg Intramuscular Once Sanjuana Kava, NP      . thiamine (VITAMIN B-1) tablet 100 mg  100 mg Oral Daily Sanjuana Kava, NP   100 mg  at 06/12/12 6440    Lab Results:  Results for orders placed during the hospital encounter of 06/10/12 (from the past 72 hour(s))  DRUGS OF ABUSE SCREEN W/O ALC, ROUTINE URINE     Status: Abnormal   Collection Time   06/12/12  6:03 AM      Component Value Range Comment   Marijuana Metabolite NEGATIVE  Negative    Amphetamine Screen, Ur NEGATIVE  Negative    Barbiturate Quant, Ur POSITIVE (*) Negative    Methadone NEGATIVE  Negative    Benzodiazepines. POSITIVE (*) Negative    Phencyclidine (PCP) NEGATIVE  Negative    Cocaine Metabolites NEGATIVE  Negative    Opiate Screen, Urine NEGATIVE  Negative    Propoxyphene NEGATIVE  Negative    Creatinine,U 87.3       RISK REDUCTION FACTORS: What pt has learned from hospital stay is that staff take the safety of the other patients seriously  Risk of self harm is elevated by his bipolar disorder, his chronic pain, and his addictions.  It is unclear what has to live for as he describes a desire to get clean for the mother of his child, but he is unwilling or unable to follow direct staff instructions long enough to stay in a program.  [facility] may be his best opportunity to get clean and sober as he seems unable to fully participate in a free/ public setting such as this hospital.  Risk of harm to others is elevated by his willingness to trade items with known others that have smuggled controlled substances here.  Pt seen in treatment team where he was confronted about his association with someone who had smuggled Xanax tabs into the hospital and then left that meeting where he had been specifically cautioned to have no further contact with him and on video camera was observed to have received further items from the hand of this other patient.  PLAN: Discharge home Continue Medication List  As of 06/12/2012  3:36 PM   TAKE these medications      Indication    amitriptyline 25 MG tablet   Commonly known as: ELAVIL   Take 1 tablet (25 mg total) by  mouth at bedtime. For pain management, depression, and insomnia.       carbamazepine 200 MG tablet   Commonly known as: TEGRETOL   Take 1 tablet (200 mg total) by mouth 2 (two) times daily. For mood control.       cloNIDine 0.1 MG tablet   Commonly known as: CATAPRES   For opiate withdrawal symptoms, Take by mouth 4 a day on Aug 1  and 2, then 2 a day for Aug 3 and 4, then stop       meloxicam 7.5 MG tablet   Commonly known as: MOBIC   Take 1 tablet (7.5 mg total) by mouth daily. For inflammation       methocarbamol 500 MG tablet   Commonly known as: ROBAXIN   Take 1 tablet (500 mg total) by mouth 3 (three) times daily. For back spasms.       nicotine 21 mg/24hr patch   Commonly known as: NICODERM CQ - dosed in mg/24 hours   Place 1 patch onto the skin every morning. For smoking cessation. Nicotine is a gateway drug and should be stopped.       pantoprazole 20 MG tablet   Commonly known as: PROTONIX   Take 1 tablet (20 mg total) by mouth 2 (two) times daily before a meal. For control of stomach acid secretion and helps GERD.       sertraline 100 MG tablet   Commonly known as: ZOLOFT   Take 1.5 tablets (150 mg total) by mouth daily. For depression. Pt takes 1.5 tab for 150 mg dose            Follow-up recommendations:  Activities: Resume typical activities Diet: Resume typical diet Other: Follow up with outpatient provider and report any side effects to out patient prescriber.  Plan: Pt was confronted in treatment team with nurse administrator Everlene Balls present about his involvement with his roommate in smuggling into the facility and then sharing benzodiazepines while in the hospital.  The patient was not willing to fully own his having a lighter on the unit, but was able to admit that the lighter found was his.  He describes a past history of antisocial behavior.  This was discussed with him in the treatment team where he needs to get completely honest with himself in order  for him to fully engage in his recovery.  He admitted that he has no resistance to temptation.  He was challenged that he needed to start making an effort with resisting temptations.  He was advised to have no further contact with his roommate that has been moved to another hall.  He was advised that he would need to voluntarily empty his pockets after meals and to not have visits with his girlfriend on the unit for at least 24 hours and then we would re look at that privilege.  He was observed on camera in the cafeteria to go directly over to the ex roommate and receive something from him and put what he received from the ex roommate into his pocket.  The handoff was deliberate and practiced and under the nose of staff.  He is not making a sincere effort in his overt behaviors to being honest with his recovery.  It seems that to him the responsibility of any behavior is on the staff to prove that he was intending any malicious intent.  He takes no responsibility for his behavior or sees problems with his saying one thing that he wants rehab while his behavior screams the opposite. There are rumors that there are 100 Xanax pills that have been brought into the hospital.  There is a collection of pills that appear to be Xanax bars and round pills of xanax that have been recovered today from a folded piece of paper that fell out of the pocket of the ex roommate on the [address].  Will discharge the patient as he is  posing a risk to the safety of the other well intended patients on the units.  Pt denies any desire to harm himself and gives a list of reasons why he wants and needs long term rehab.  He asked in treatment team to be allowed to stay and not go out before he can get into a long term rehab facility.  He is overtly defying what staff have asked him to do and continues to put himself and others in danger by covertly exchanging items with someone who he had been warned to stay away from.  He is being informed  of this latest discovery and will be discharged now.  His risk of harm self and others depends on the level of involvement that he chooses with those who are less than fully interested in helping themselves.  Neythan Kozlov 06/12/2012, 3:32 PM

## 2012-06-12 NOTE — Progress Notes (Signed)
Pt attended discharge planning group and actively participated in group.  SW provided pt with today's workbook.  Pt denies having depression, anxiety and SI.  Pt is still verbalized a plan to go to further treatment from here.  Pt is concerned about going home first before a direct transfer, stating he thinks he will use.  SW provided pt with information on [facility].  Pt states he wants to look over the information with his girlfriend before the referral is made.  Concerns of drug use while here were addressed.  Pt admits to using xanax provided by another pt that was smuggled in.  Pt also had a lighter on him and states that this was already in his pant's pocket and was not taken from him so he kept it.  No further needs voiced by pt at this time.    Caleb Rowe, [place] 06/12/2012  2:43 PM

## 2012-06-12 NOTE — Progress Notes (Signed)
Patient ID: Caleb Rowe, male   DOB: [birth year], 26 y.o.   MRN: [id number] Pt. Found in bathroom with roommate, by Evaristo Bury, MHT, exhibiting suspicious behavior. MHT reported to Clinical research associate, Press photographer and [employer]. Pt. Room was searched and so was he. Xanax pill found in pt. Room. Pt. Reports it wasn't his pill, but does say the lighter found in the room was his. Pt. Say lighter was in the pocket of a pair of pants brought in. "Man I'm sorry bout the lighter." I know I need to be here, so does he."  Writer notified on call Sandrea Hammond and pt. Was moved to another room. Pt. Went with no conformation.  Also received orders for urine drug screen in a.m.

## 2012-06-13 DIAGNOSIS — F1110 Opioid abuse, uncomplicated: Secondary | ICD-10-CM

## 2012-06-13 DIAGNOSIS — F329 Major depressive disorder, single episode, unspecified: Secondary | ICD-10-CM

## 2012-06-13 LAB — BARBITURATE, URINE, CONFIRMATION
Butalbital UR Quant: 484 ng/mL
Pentobarbital GC/MS Conf: NEGATIVE ng/mL
Secobarbital GC/MS Conf: NEGATIVE ng/mL

## 2012-06-13 MED ORDER — PANTOPRAZOLE SODIUM 20 MG PO TBEC
20.0000 mg | DELAYED_RELEASE_TABLET | Freq: Two times a day (BID) | ORAL | Status: DC
  Administered 2012-06-13 – 2012-06-14 (×4): 20 mg via ORAL
  Filled 2012-06-13 (×6): qty 1

## 2012-06-13 MED ORDER — AMITRIPTYLINE HCL 25 MG PO TABS
25.0000 mg | ORAL_TABLET | Freq: Every day | ORAL | Status: DC
  Administered 2012-06-13 (×2): 25 mg via ORAL
  Filled 2012-06-13 (×2): qty 1

## 2012-06-13 MED ORDER — NICOTINE 21 MG/24HR TD PT24
21.0000 mg | MEDICATED_PATCH | Freq: Every morning | TRANSDERMAL | Status: DC
  Administered 2012-06-13 – 2012-06-14 (×2): 21 mg via TRANSDERMAL
  Filled 2012-06-13 (×2): qty 1

## 2012-06-13 MED ORDER — SERTRALINE HCL 50 MG PO TABS
150.0000 mg | ORAL_TABLET | Freq: Every day | ORAL | Status: DC
  Administered 2012-06-13 – 2012-06-14 (×2): 150 mg via ORAL
  Filled 2012-06-13 (×2): qty 3

## 2012-06-13 MED ORDER — CARBAMAZEPINE 200 MG PO TABS
200.0000 mg | ORAL_TABLET | Freq: Two times a day (BID) | ORAL | Status: DC
  Administered 2012-06-13 – 2012-06-14 (×4): 200 mg via ORAL
  Filled 2012-06-13 (×7): qty 1

## 2012-06-13 NOTE — Consult Note (Signed)
Reason for Consult:Opioid dependence Referring Physician: Trixie Dredge, PA  Caleb Rowe is an 26 y.o. male.  HPI: Patient was seen and chart reviewed. Patient was recently admitted to [facility] for the opioid dependence and the possible the withdrawal symptoms, and received the Clonidine protocol. Patient was discharged when he was found disturbing the therapeutic milieu by bringing in 100 pills of Xanax and possibly distributed. He did not give up on his lighter which is carrying in the secured inpatient psychiatric locked facility. Reportedly he was known for lying and considered as a sociopathic personality disorder. Reportedly patient mother reported he has been threatening to hurt himself and spoken with his girlfriend, lost his job, strained relationships with his girlfriend. He was not welcome to his parent's home. Patient reportedly have a Media planner and seeing a psychiatrist at [facility]. His the drug screen was positive for benzodiazepines and barbiturates. Patient requested he [facility] [place] [facility] for substance rehabilitation services. Reportedly, he made initial contact yesterday and provided basic insurance information and waiting to hear from them.  Patient was calm, quite cooperative. He does not have a symptoms of opiate withdrawal during this visit. He was sitting side of his bed and the eating his lunch without discomfort. He has a multiple tattoos all over his body. Patient stated mood is I'm upset because I was discharged from the unit and have her passive suicidal thoughts without any intentions or plans. Patient has denied homicidal ideations, intentions, or plans. Patient has no evidence of auditory or visual hallucinations, delusions, or paranoia. Patient is an poor to fair insight, judgment and impulse control.  Past Medical History  Diagnosis Date  . Seizures   . Depression   .  Sociopathic personality disorder     Past Surgical History  Procedure Date  . Knee surgery     No family history on file.  Social History:  reports that he has been smoking Cigarettes.  He has a 2.5 pack-year smoking history. He does not have any smokeless tobacco history on file. He reports that he uses illicit drugs (Other-see comments and Barbituates). He reports that he does not drink alcohol.  Allergies: No Known Allergies  Medications: I have reviewed the patient's current medications.  Results for orders placed during the hospital encounter of 06/12/12 (from the past 48 hour(s))  URINALYSIS, ROUTINE W REFLEX MICROSCOPIC     Status: Normal   Collection Time   06/12/12  9:01 PM      Component Value Range Comment   Color, Urine YELLOW  YELLOW    APPearance CLEAR  CLEAR    Specific Gravity, Urine 1.025  1.005 - 1.030    pH 6.0  5.0 - 8.0    Glucose, UA NEGATIVE  NEGATIVE mg/dL    Hgb urine dipstick NEGATIVE  NEGATIVE    Bilirubin Urine NEGATIVE  NEGATIVE    Ketones, ur NEGATIVE  NEGATIVE mg/dL    Protein, ur NEGATIVE  NEGATIVE mg/dL    Urobilinogen, UA 0.2  0.0 - 1.0 mg/dL    Nitrite NEGATIVE  NEGATIVE    Leukocytes, UA NEGATIVE  NEGATIVE MICROSCOPIC NOT DONE ON URINES WITH NEGATIVE PROTEIN, BLOOD, LEUKOCYTES, NITRITE, OR GLUCOSE <1000 mg/dL.  URINE RAPID DRUG SCREEN (HOSP PERFORMED)     Status: Abnormal   Collection Time   06/12/12  9:01 PM      Component Value Range Comment   Opiates  NONE DETECTED  NONE DETECTED    Cocaine NONE DETECTED  NONE DETECTED    Benzodiazepines POSITIVE (*) NONE DETECTED    Amphetamines NONE DETECTED  NONE DETECTED    Tetrahydrocannabinol NONE DETECTED  NONE DETECTED    Barbiturates POSITIVE (*) NONE DETECTED   CBC     Status: Normal   Collection Time   06/12/12  9:31 PM      Component Value Range Comment   WBC 8.5  4.0 - 10.5 K/uL    RBC 4.58  4.22 - 5.81 MIL/uL    Hemoglobin 13.6  13.0 - 17.0 g/dL    HCT 45.4  09.8 - 11.9 %    MCV 86.0   78.0 - 100.0 fL    MCH 29.7  26.0 - 34.0 pg    MCHC 34.5  30.0 - 36.0 g/dL    RDW 14.7  82.9 - 56.2 %    Platelets 195  150 - 400 K/uL   COMPREHENSIVE METABOLIC PANEL     Status: Abnormal   Collection Time   06/12/12  9:31 PM      Component Value Range Comment   Sodium 138  135 - 145 mEq/L    Potassium 3.7  3.5 - 5.1 mEq/L    Chloride 99  96 - 112 mEq/L    CO2 31  19 - 32 mEq/L    Glucose, Bld 92  70 - 99 mg/dL    BUN 15  6 - 23 mg/dL    Creatinine, Ser 1.30  0.50 - 1.35 mg/dL    Calcium 9.3  8.4 - 86.5 mg/dL    Total Protein 6.7  6.0 - 8.3 g/dL    Albumin 4.2  3.5 - 5.2 g/dL    AST 17  0 - 37 U/L    ALT 12  0 - 53 U/L    Alkaline Phosphatase 58  39 - 117 U/L    Total Bilirubin 0.2 (*) 0.3 - 1.2 mg/dL    GFR calc non Af Amer 84 (*) >90 mL/min    GFR calc Af Amer >90  >90 mL/min   ETHANOL     Status: Normal   Collection Time   06/12/12  9:31 PM      Component Value Range Comment   Alcohol, Ethyl (B) <11  0 - 11 mg/dL   ACETAMINOPHEN LEVEL     Status: Normal   Collection Time   06/12/12  9:31 PM      Component Value Range Comment   Acetaminophen (Tylenol), Serum <15.0  10 - 30 ug/mL   SALICYLATE LEVEL     Status: Abnormal   Collection Time   06/12/12  9:31 PM      Component Value Range Comment   Salicylate Lvl <2.0 (*) 2.8 - 20.0 mg/dL     No results found.  No depression, No anxiety, No psychosis and Positive for illegal drug usage Blood pressure 105/56, pulse 65, temperature 97.3 F (36.3 C), temperature source Oral, resp. rate 20, height 6' (1.829 m), weight 160 lb (72.576 kg), SpO2 98.00%.   Assessment/Plan: Opioid dependence/UDS positive for benzo's and barbuturates Mood disorder  Sociopathic personality disorder   Patient will be discharged with plan of long term substance abuse rehab services and he prefers to be admitted to [facility] [place], substance abuse program.   Darrol Jump R. 06/13/2012, 11:03 AM

## 2012-06-13 NOTE — BHH Counselor (Signed)
TC with [facility]. Completed initial screening and faxed information for review. RTC from [facility] who stated that pt's insurance had expired 06/12/12. Pt would be responsible for payment out of pocket.

## 2012-06-13 NOTE — ED Provider Notes (Signed)
Medical screening examination/treatment/procedure(s) were performed by non-physician practitioner and as supervising physician I was immediately available for consultation/collaboration.   Molleigh Huot Y. Klover Priestly, MD 06/13/12 1258 

## 2012-06-13 NOTE — BH Assessment (Signed)
Assessment Note   Caleb Rowe is an 26 y.o. male. Pt presented requesting detox from pain pills. Upon initial assessment, pt reported he had been abusing pain pills for the past 4 years and had stopped heroin a year ago. Pt reported using as many pills as he could get his hands on. Pt denied SI/HI or psychosis. Pt had been to the ED a few days earlier requesting detox, but upon release from the hospital, pt was using benzos. Pt's mother shared collateral information stating that pt has recently lost his job, got into a big argument with his girlfriend resulting with him having to be escorted from the dwelling by the police, and making SI threats and comments the evening of 7/31 to his girlfriend. Pt currently denies SI, but stated he has been destroying his life. Mother also stated that pt will not always be truthful and minimizes his behaviors. Family and pt agree things are out of control. Pt stated he is not able to say no when there are drugs available for his use.  Axis I: Substance Abuse and Substance Induced Mood Disorder Axis II: Deferred Axis III:  Past Medical History  Diagnosis Date  . Seizures   . Depression   . Sociopathic personality disorder    Axis IV: economic problems, housing problems, occupational problems and other psychosocial or environmental problems Axis V: 41-50 serious symptoms  Past Medical History:  Past Medical History  Diagnosis Date  . Seizures   . Depression   . Sociopathic personality disorder     Past Surgical History  Procedure Date  . Knee surgery     Family History: No family history on file.  Social History:  reports that he has been smoking Cigarettes.  He has a 2.5 pack-year smoking history. He does not have any smokeless tobacco history on file. He reports that he uses illicit drugs (Other-see comments and Barbituates). He reports that he does not drink alcohol.  Additional Social History:  Alcohol / Drug Use History of alcohol / drug  use?: Yes Substance #1 Name of Substance 1: Pain Pills  1 - Age of First Use: 22 1 - Amount (size/oz): varies  1 - Frequency: Daily 1 - Duration: ongoing 1 - Last Use / Amount: 06-08-2012 Substance #2 Name of Substance 2: Roxicet  2 - Age of First Use: 23 2 - Amount (size/oz): 25mg  2 - Frequency: Daily  2 - Duration: ongoing  2 - Last Use / Amount: two weeks ago  Substance #3 Name of Substance 3: Suboxone  3 - Age of First Use: 23 3 - Amount (size/oz): 6mg  3 - Frequency: Daily  3 - Duration: ongoing  3 - Last Use / Amount: 06-09-2012  CIWA: CIWA-Ar BP: 101/63 mmHg Pulse Rate: 66  Nausea and Vomiting: mild nausea with no vomiting Tactile Disturbances: none Tremor: no tremor Auditory Disturbances: not present Paroxysmal Sweats: two Visual Disturbances: not present Anxiety: no anxiety, at ease Headache, Fullness in Head: moderate Agitation: normal activity Orientation and Clouding of Sensorium: oriented and can do serial additions CIWA-Ar Total: 6  COWS: Clinical Opiate Withdrawal Scale (COWS) Resting Pulse Rate: Pulse Rate 80 or below Sweating: Subjective report of chills or flushing Restlessness: Able to sit still Pupil Size: Pupils pinned or normal size for room light Bone or Joint Aches: Mild diffuse discomfort Runny Nose or Tearing: Not present GI Upset: No GI symptoms Tremor: No tremor Yawning: No yawning Anxiety or Irritability: None Gooseflesh Skin: Skin is smooth COWS Total  Score: 2   Allergies: No Known Allergies  Home Medications:  (Not in a hospital admission)  OB/GYN Status:  No LMP for male patient.  General Assessment Data [facility] of [facility]: [facility] ACT Assessment: Yes Living Arrangements: Parent (Now staying w/ parents) Can pt return to current living arrangement?: Yes Admission Status: Voluntary Is patient capable of signing voluntary admission?: Yes Transfer from: Acute Hospital Referral Source: Self/Family/Friend  Education  Status Is patient currently in school?: No  Risk to self Suicidal Ideation: Yes-Currently Present Suicidal Intent: No-Not Currently/Within Last 6 Months Is patient at risk for suicide?: Yes Suicidal Plan?: No-Not Currently/Within Last 6 Months Access to Means: Yes Specify Access to Suicidal Means: Sharps, drugs, etc What has been your use of drugs/alcohol within the last 12 months?: Opiates; Abuses OTC Previous Attempts/Gestures: No (pt denied) How many times?: 0  Other Self Harm Risks: Substance Abuse Triggers for Past Attempts: Unpredictable Intentional Self Injurious Behavior: None Family Suicide History: No Recent stressful life event(s): Conflict (Comment);Job Loss (Pt lost job, kicked out of his girlfriend's house; SA) Persecutory voices/beliefs?: No Depression: Yes Depression Symptoms: Isolating;Guilt;Loss of interest in usual pleasures;Feeling angry/irritable Substance abuse history and/or treatment for substance abuse?: Yes Suicide prevention information given to non-admitted patients: Not applicable  Risk to Others Homicidal Ideation: No Thoughts of Harm to Others: No Current Homicidal Intent: No Current Homicidal Plan: No Access to Homicidal Means: No Identified Victim: N/A History of harm to others?: No Assessment of Violence: None Noted Violent Behavior Description: None Does patient have access to weapons?: No (pt denied) Criminal Charges Pending?: No (pt denied) Does patient have a court date: No  Psychosis Hallucinations: None noted Delusions: None noted  Mental Status Report Appear/Hygiene: Disheveled Eye Contact: Good Motor Activity: Freedom of movement Speech: Logical/coherent Level of Consciousness: Sleeping;Quiet/awake Mood: Depressed Affect: Appropriate to circumstance Anxiety Level: Minimal Thought Processes: Coherent;Relevant Judgement: Impaired Orientation: Person;Place;Time;Situation Obsessive Compulsive Thoughts/Behaviors:  None  Cognitive Functioning Concentration: Normal Memory: Recent Intact;Remote Intact IQ: Average Insight: Poor Impulse Control: Poor Appetite: Fair Weight Loss: 0  Weight Gain: 0  Sleep: No Change Total Hours of Sleep: 4  Vegetative Symptoms: Decreased grooming  ADLScreening [facility] Assessment Services) Patient's cognitive ability adequate to safely complete daily activities?: Yes Patient able to express need for assistance with ADLs?: Yes Independently performs ADLs?: Yes  Abuse/Neglect [facility]) Physical Abuse: Denies Verbal Abuse: Denies Sexual Abuse: Denies  Prior Inpatient Therapy Prior Inpatient Therapy: Yes Prior Therapy Dates: 2013 Prior Therapy Facilty/Provider(s): [facility] (Was only IPT for 2 days) Reason for Treatment: SA   Prior Outpatient Therapy Prior Outpatient Therapy: No Prior Therapy Dates: None  Prior Therapy Facilty/Provider(s): None  Reason for Treatment: None   ADL Screening (condition at time of admission) Patient's cognitive ability adequate to safely complete daily activities?: Yes Patient able to express need for assistance with ADLs?: Yes Independently performs ADLs?: Yes Weakness of Legs: None Weakness of Arms/Hands: None  Home Assistive Devices/Equipment Home Assistive Devices/Equipment: None    Abuse/Neglect Assessment (Assessment to be complete while patient is alone) Physical Abuse: Denies Verbal Abuse: Denies Sexual Abuse: Denies Exploitation of patient/patient's resources: Denies Self-Neglect: Denies Values / Beliefs Cultural Requests During Hospitalization: None Spiritual Requests During Hospitalization: None Consults Spiritual Care Consult Needed: No Social Work Consult Needed: No   Nutrition Screen Diet: Regular  Additional Information 1:1 In Past 12 Months?: No CIRT Risk: No Elopement Risk: No Does patient have medical clearance?: Yes     Disposition:  Disposition Disposition of Patient: Referred  to [facility], [facility]; [facility]) Type of inpatient treatment program: Adult Patient referred to: [facility]; [facility])  On Site Evaluation by:   Reviewed with Physician:     Romeo Apple 06/13/2012 4:09 PM

## 2012-06-13 NOTE — Progress Notes (Signed)
Patient Discharge Instructions: No scheduled follow up, pt. Will follow up with AA meetings.  Caleb Rowe, 06/13/2012, 12:22 PM

## 2012-06-13 NOTE — BH Assessment (Signed)
Assessment Note   Caleb Rowe is an 26 y.o. male.   Patient reports detox from pain pills. Patient reports that he has been abusing pain pills for the past four years. Patient reports that he stopped using heroin a year ago. Patients UDS was positive for Benzos. Patient COWS was 8.  Patient BAL was less than 11.   Patient denies any SI/HI.  Patient reports using as many pills as he can get his hands on. Patient denies any psychosis.  Patient denies any psychiatric hospitalizations. Patient reports detox at [facility] in July 2013.     Axis I: Opioid Dependence  Axis II: Deferred Axis III:  Past Medical History  Diagnosis Date  . Seizures   . Depression   . Sociopathic personality disorder    Axis IV: economic problems, housing problems, occupational problems, other psychosocial or environmental problems, problems related to social environment and problems with primary support group Axis V: 41-50 serious symptoms  Past Medical History:  Past Medical History  Diagnosis Date  . Seizures   . Depression   . Sociopathic personality disorder     Past Surgical History  Procedure Date  . Knee surgery     Family History: No family history on file.  Social History:  reports that he has been smoking Cigarettes.  He has a 2.5 pack-year smoking history. He does not have any smokeless tobacco history on file. He reports that he uses illicit drugs (Other-see comments and Barbituates). He reports that he does not drink alcohol.  Additional Social History:  Alcohol / Drug Use History of alcohol / drug use?: Yes Substance #1 Name of Substance 1: Pain Pills  1 - Age of First Use: 22 1 - Amount (size/oz): varies  1 - Frequency: Daily 1 - Duration: ongoing 1 - Last Use / Amount: 06-08-2012 Substance #2 Name of Substance 2: Roxicet  2 - Age of First Use: 23 2 - Amount (size/oz): 25mg  2 - Frequency: Daily  2 - Duration: ongoing  2 - Last Use / Amount: two weeks ago  Substance  #3 Name of Substance 3: Suboxone  3 - Age of First Use: 23 3 - Amount (size/oz): 6mg  3 - Frequency: Daily  3 - Duration: ongoing  3 - Last Use / Amount: 06-09-2012  CIWA: CIWA-Ar BP: 114/70 mmHg Pulse Rate: 72  COWS: Clinical Opiate Withdrawal Scale (COWS) Resting Pulse Rate: Pulse Rate 81-100 Sweating: Subjective report of chills or flushing Restlessness: Reports difficulty sitting still, but is able to do so Pupil Size: Pupils pinned or normal size for room light Bone or Joint Aches: Mild diffuse discomfort Runny Nose or Tearing: Nasal stuffiness or unusually moist eyes GI Upset: Stomach cramps Tremor: No tremor Yawning: Yawning once or twice during assessment Anxiety or Irritability: Patient reports increasing irritability or anxiousness Gooseflesh Skin: Skin is smooth COWS Total Score: 8   Allergies: No Known Allergies  Home Medications:  (Not in a hospital admission)  OB/GYN Status:  No LMP for male patient.  General Assessment Data [facility] ACT Assessment: Yes Living Arrangements: Parent Can pt return to current living arrangement?: Yes Admission Status: Voluntary Is patient capable of signing voluntary admission?: Yes Transfer from: Home Referral Source: Self/Family/Friend  Education Status Is patient currently in school?: No  Risk to self Suicidal Ideation: No Suicidal Intent: No Is patient at risk for suicide?: No Suicidal Plan?: No Access to Means: No What has been your use of drugs/alcohol within the last  12 months?: Pain pills  Previous Attempts/Gestures: No How many times?: 0  Other Self Harm Risks: 0 Triggers for Past Attempts: None known Intentional Self Injurious Behavior: None Family Suicide History: No Recent stressful life event(s): Job Loss;Other (Comment) Persecutory voices/beliefs?: No Depression: Yes Depression Symptoms: Isolating;Guilt;Feeling worthless/self pity Substance abuse history and/or treatment for  substance abuse?: Yes Suicide prevention information given to non-admitted patients: Not applicable  Risk to Others Homicidal Ideation: No Thoughts of Harm to Others: No Current Homicidal Intent: No Current Homicidal Plan: No Access to Homicidal Means: No Identified Victim: none reported History of harm to others?: No Assessment of Violence: None Noted Violent Behavior Description: none reported Does patient have access to weapons?: No Criminal Charges Pending?: No Does patient have a court date: No  Psychosis Hallucinations: None noted Delusions: None noted  Mental Status Report Appear/Hygiene: Improved Eye Contact: Fair Motor Activity: Agitation;Restlessness Speech: Logical/coherent Level of Consciousness: Alert Mood: Helpless;Sullen Affect: Anxious Anxiety Level: Minimal Thought Processes: Coherent;Relevant Judgement: Unimpaired Orientation: Person;Place;Time;Situation Obsessive Compulsive Thoughts/Behaviors: None  Cognitive Functioning Concentration: Decreased Memory: Recent Intact;Remote Intact IQ: Average Insight: Fair Impulse Control: Poor Appetite: Fair Weight Loss: 0  Weight Gain: 0  Sleep: Decreased Total Hours of Sleep: 4  Vegetative Symptoms: Decreased grooming  ADLScreening [facility] Assessment Services) Patient's cognitive ability adequate to safely complete daily activities?: Yes Patient able to express need for assistance with ADLs?: Yes Independently performs ADLs?: Yes  Abuse/Neglect [facility]) Physical Abuse: Denies Verbal Abuse: Denies Sexual Abuse: Denies  Prior Inpatient Therapy Prior Inpatient Therapy: Yes Prior Therapy Dates: 2013 Prior [facility] Facilty[facility]Provider([facility]): [facility] Reason for Treatment: SA   Prior Outpatient Therapy Prior Outpatient Therapy: No Prior Therapy Dates: None  Prior Therapy Facilty/Provider(s): None  Reason for Treatment: None   ADL Screening (condition at time of admission) Patient's cognitive ability adequate to  safely complete daily activities?: Yes Patient able to express need for assistance with ADLs?: Yes Independently performs ADLs?: Yes Weakness of Legs: None Weakness of Arms/Hands: None  Home Assistive Devices/Equipment Home Assistive Devices/Equipment: None    Abuse/Neglect Assessment (Assessment to be complete while patient is alone) Physical Abuse: Denies Verbal Abuse: Denies Sexual Abuse: Denies Exploitation of patient/patient's resources: Denies Self-Neglect: Denies Values / Beliefs Cultural Requests During Hospitalization: None Spiritual Requests During Hospitalization: None Consults Spiritual Care Consult Needed: No Social Work Consult Needed: No      Additional Information 1:1 In Past 12 Months?: No CIRT Risk: No Elopement Risk: No Does patient have medical clearance?: Yes     Disposition: Pending placement at [facility].Disposition Disposition of Patient: Inpatient treatment program;Referred to Type of inpatient treatment program: Adult Patient referred to: [facility]  On Site Evaluation by:   Reviewed with Physician:     Phillip Heal LaVerne 06/13/2012 3:40 AM

## 2012-06-14 NOTE — ED Notes (Signed)
Up tot he bathroom to shower and change scrubs 

## 2012-06-14 NOTE — BH Assessment (Addendum)
Assessment Note   Caleb Rowe is an 26 y.o. male.  Patient is being referred to [facility].  He currently desires tx for his addiction to pain pills.  He initially said that he has had some SI but clarified that this is when he is upset with girlfriend (who is mother of child).  He has no current SI and wants only SA related services at this time.  No current HI or A/V hallucinations.  Patient reports that he has lost his job because "I cannot say no when pills are available."  Patient has not had outpatient services and is seeing rehabilitation bed if available.  On-coming clinician Georgina Quint) will check to see if a rehabilitation bed can be procured at [facility] or [facility] tonight.  If not, referral information can be given to patient and he will be discharged and will follow up on his own.  Patient reports that his father said that he may come home until he gets into a rehab bed. Previous Note: Caleb Rowe is an 26 y.o. male. Pt presented requesting detox from pain pills. Upon initial assessment, pt reported he had been abusing pain pills for the past 4 years and had stopped heroin a year ago. Pt reported using as many pills as he could get his hands on. Pt denied SI/HI or psychosis. Pt had been to the ED a few days earlier requesting detox, but upon release from the hospital, pt was using benzos. Pt's mother shared collateral information stating that pt has recently lost his job, got into a big argument with his girlfriend resulting with him having to be escorted from the dwelling by the police, and making SI threats and comments the evening of 7/31 to his girlfriend. Pt currently denies SI, but stated he has been destroying his life. Mother also stated that pt will not always be truthful and minimizes his behaviors. Family and pt agree things are out of control. Pt stated he is not able to say no when there are drugs available for his use.  Axis I: 305.50 Opioid dependence Axis II: Deferred Axis  III:  Past Medical History  Diagnosis Date  . Seizures   . Depression   . Sociopathic personality disorder    Axis IV: occupational problems, other psychosocial or environmental problems and problems with primary support group Axis V: 41-50 serious symptoms  Past Medical History:  Past Medical History  Diagnosis Date  . Seizures   . Depression   . Sociopathic personality disorder     Past Surgical History  Procedure Date  . Knee surgery     Family History: No family history on file.  Social History:  reports that he has been smoking Cigarettes.  He has a 2.5 pack-year smoking history. He does not have any smokeless tobacco history on file. He reports that he uses illicit drugs (Other-see comments and Barbituates). He reports that he does not drink alcohol.  Additional Social History:  Alcohol / Drug Use History of alcohol / drug use?: Yes Substance #1 Name of Substance 1: Pain Pills  1 - Age of First Use: 22 1 - Amount (size/oz): varies  1 - Frequency: Daily 1 - Duration: ongoing 1 - Last Use / Amount: 06-08-2012 Substance #2 Name of Substance 2: Roxicet  2 - Age of First Use: 23 2 - Amount (size/oz): 25mg  2 - Frequency: Daily  2 - Duration: ongoing  2 - Last Use / Amount: two weeks ago  Substance #3 Name of  Substance 3: Suboxone  3 - Age of First Use: 23 3 - Amount (size/oz): 6mg  3 - Frequency: Daily  3 - Duration: ongoing  3 - Last Use / Amount: 06-09-2012  CIWA: CIWA-Ar BP: 130/91 mmHg Pulse Rate: 109  Nausea and Vomiting: mild nausea with no vomiting Tactile Disturbances: none Tremor: no tremor Auditory Disturbances: not present Paroxysmal Sweats: two Visual Disturbances: not present Anxiety: no anxiety, at ease Headache, Fullness in Head: moderate Agitation: normal activity Orientation and Clouding of Sensorium: oriented and can do serial additions CIWA-Ar Total: 6  COWS: Clinical Opiate Withdrawal Scale (COWS) Resting Pulse Rate: Pulse Rate  101-120 Sweating: Subjective report of chills or flushing Restlessness: Able to sit still Pupil Size: Pupils pinned or normal size for room light Bone or Joint Aches: Not present Runny Nose or Tearing: Nasal stuffiness or unusually moist eyes GI Upset: No GI symptoms Tremor: No tremor Yawning: No yawning Anxiety or Irritability: Patient reports increasing irritability or anxiousness Gooseflesh Skin: Skin is smooth COWS Total Score: 5   Allergies: No Known Allergies  Home Medications:  (Not in a hospital admission)  OB/GYN Status:  No LMP for male patient.  General Assessment Data [facility] ACT Assessment: Yes Living Arrangements: Parent (Now staying with parents) Can pt return to current living arrangement?: Yes Admission Status: Voluntary Is patient capable of signing voluntary admission?: Yes Transfer from: Acute Hospital Referral Source: Self/Family/Friend  Education Status Is patient currently in school?: No  Risk to self Suicidal Ideation: No Suicidal Intent: No Is patient at risk for suicide?: No Suicidal Plan?: No Access to Means: No Specify Access to Suicidal Means: Denies current suicidality What has been your use of drugs/alcohol within the last 12 months?: Opiates, Abuses OTC Previous Attempts/Gestures: No How many times?: 0  Other Self Harm Risks: SA Triggers for Past Attempts: Unpredictable Intentional Self Injurious Behavior: None Family Suicide History: No Recent stressful life event(s): Job Loss;Turmoil (Comment);Conflict (Comment) (Pt lost job; kicked out of grlfrnd's house; SA) Persecutory voices/beliefs?: No Depression: Yes Depression Symptoms: Despondent;Feeling worthless/self pity;Isolating Substance abuse history and/or treatment for substance abuse?: Yes Suicide prevention information given to non-admitted patients: Not applicable  Risk to Others Homicidal Ideation: No Thoughts of Harm to Others: No Current Homicidal  Intent: No Current Homicidal Plan: No Access to Homicidal Means: No Identified Victim: No one History of harm to others?: No Assessment of Violence: None Noted Violent Behavior Description: None Does patient have access to weapons?: No (Pt denies) Criminal Charges Pending?: No (Pt denies) Does patient have a court date: No  Psychosis Hallucinations: None noted Delusions: None noted  Mental Status Report Appear/Hygiene:  (Casual) Eye Contact: Good Motor Activity: Unremarkable Speech: Logical/coherent Level of Consciousness: Quiet/awake Mood: Depressed;Sad Affect: Appropriate to circumstance Anxiety Level: Minimal Thought Processes: Coherent;Relevant Judgement: Impaired Orientation: Person;Place;Time;Situation Obsessive Compulsive Thoughts/Behaviors: None  Cognitive Functioning Concentration: Normal Memory: Recent Intact;Remote Intact IQ: Average Insight: Fair Impulse Control: Poor Appetite: Fair Weight Loss: 0  Weight Gain: 0  Sleep: No Change Total Hours of Sleep: 6  Vegetative Symptoms: Decreased grooming  ADLScreening [facility] Assessment Services) Patient's cognitive ability adequate to safely complete daily activities?: Yes Patient able to express need for assistance with ADLs?: Yes Independently performs ADLs?: Yes  Abuse/Neglect [facility]) Physical Abuse: Denies Verbal Abuse: Denies Sexual Abuse: Denies  Prior Inpatient Therapy Prior Inpatient Therapy: Yes Prior Therapy Dates: 2013 Prior Therapy Facilty/Provider(s): [facility] Reason for Treatment: SA   Prior Outpatient Therapy Prior Outpatient Therapy: No Prior Therapy Dates:  None  Prior Therapy Facilty/Provider(s): None  Reason for Treatment: None   ADL Screening (condition at time of admission) Patient's cognitive ability adequate to safely complete daily activities?: Yes Patient able to express need for assistance with ADLs?: Yes Independently performs ADLs?: Yes Weakness of Legs: None Weakness of  Arms/Hands: None  Home Assistive Devices/Equipment Home Assistive Devices/Equipment: None    Abuse/Neglect Assessment (Assessment to be complete while patient is alone) Physical Abuse: Denies Verbal Abuse: Denies Sexual Abuse: Denies Exploitation of patient/patient's resources: Denies Self-Neglect: Denies Values / Beliefs Cultural Requests During Hospitalization: None Spiritual Requests During Hospitalization: None Consults Spiritual Care Consult Needed: No Social Work Consult Needed: No   Nutrition Screen Diet: Regular  Additional Information 1:1 In Past 12 Months?: No CIRT Risk: No Elopement Risk: No Does patient have medical clearance?: Yes     Disposition:  Disposition Disposition of Patient: Referred to Type of inpatient treatment program: Adult Patient referred to:  ([facility])  On Site Evaluation by:   Reviewed with Physician:     Beatriz Stallion Ray 06/14/2012 12:57 PM

## 2012-06-14 NOTE — ED Notes (Signed)
Pt's dad into see 

## 2012-06-14 NOTE — ED Notes (Signed)
Talking w/ his father

## 2012-06-14 NOTE — ED Notes (Signed)
ACT in w/ pt 

## 2012-06-14 NOTE — ED Notes (Signed)
Reports vistaril helped w/ anxiety, and ha is better-now only slight

## 2012-06-14 NOTE — ED Notes (Signed)
ACT into see 

## 2012-06-14 NOTE — ED Notes (Signed)
Berna Spare ACT into see

## 2012-06-14 NOTE — ED Provider Notes (Signed)
Pt given outpt referrals. Pt unable to be placed  Toy Baker, MD 06/14/12 1918

## 2012-06-14 NOTE — ED Notes (Signed)
Visitor in w/ pt 

## 2012-06-14 NOTE — ED Notes (Signed)
Up on the phone 

## 2012-06-14 NOTE — [facility] Counselor (Signed)
Spoke with [facility] who report no treatment beds are available at this time. Pt is requesting to be discharged with referrals. Discussed pt with EDP who agrees with plan to discharge pt home with referrals.

## 2012-06-19 NOTE — Discharge Summary (Signed)
Physician Discharge Summary Note  Patient:  Caleb Rowe is an 26 y.o., male MRN:  [id number] DOB:  1986-04-19 Patient phone:  [phone] (home)  Patient address:   [address] [place] [address]   Date of Admission:  06/10/2012 Date of Discharge: 06/12/2012  Discharge Diagnoses: Principal Problem:  *Opiate dependence, continuous Active Problems:  Opioid dependence  Bipolar affective disorder, depressed, moderate degree  Axis Diagnosis:  Axis I: Bipolar, Depressed and Opiate Dependence  Axis II: Antisocial Personality Disorder  Axis III:  Past Medical History   Diagnosis  Date   .  Seizures    .  Depression    .  Sociopathic personality disorder    Axis IV: other psychosocial or environmental problems and problems related to social environment  Axis V: 61-70 mild symptoms  Level of Care:  OP  Hospital Course:   This is a 26 year old Caucasian male, admitted to [facility] from the [facility] ED with complaints of erratic drug use, requesting detoxification. Patient reports, "I have problems with opiates. I take any drugs that I can get my hands on. 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 I stayed cleaned momentarily after that, then I went full blown to using all kinds of pills. Part of the problem was that I had a knee surgery right after getting cleaned from heroin. I was prescribed percocet for my pain, and the rest is history. I can't seem to resist using drugs. I have been using drugs non-stop x 4 years now. I work in an area whereby doing drugs is the norm. Drugs are around at all time. I have been very impulsive all my life. If it gets in my head, what ever it is I'm doing it. Drugs makes me feel good. But it has affected my life in many different ways. I have wondered if my drug use is because of some mental illness within me. I was diagnosed with bipolar disorder and anti-social  personality disorder a long time ago. I have conscience, but it does always come out when I needed it. I am saying this because I lie like a dog looking at someone right in the eye without feeling remorseful. I lie to get my way. I am tired of it".   While a patient in this hospital, Mr. Caleb Rowe received medication management for mood control, depression, insomnia, and pain management as well as for opiate withdrawal. They were ordered and received Tegretol for mood control, Elavil for pain management, depression, and insomnia, Clonidine for opiate withdrawal, Mobic for inflammation, Robaxin for back spasms, Nicoderm for smoking cessation, and Zoloft for depression. They were also enrolled in group counseling sessions and activities in which they participated actively.   Patient attended treatment team meeting this am and met with treatment team members. Pt symptoms, treatment plan and response to treatment discussed. [name] endorsed that their symptoms have improved. Pt also stated that they are stable for discharge.  They reported that from this hospital stay they had learned that staff take the safety of other patients seriously.  In other to maintain their sobriety, mood control, depression, pain control, and back spasm control, they will continue psychiatric care on outpatient basis. They will follow-up at [facility] meetings.  They refused a referral to a prescriber to continue the medications.  In addition they were instructed to take all your medications as prescribed  by your mental healthcare provider, to report any adverse effects and or reactions from your medicines to your outpatient provider promptly, patient is instructed and cautioned to not engage in alcohol and or illegal drug use while on prescription medicines, in the event of worsening symptoms, patient is instructed to call the crisis hotline, 911 and or go to the nearest ED for appropriate evaluation and treatment of symptoms.   Upon  discharge, patient adamantly denies suicidal, homicidal ideations, auditory, visual hallucinations and or delusional thinking. They left [facility] with all personal belongings via personal transportation in no apparent distress.  Consults:  None  Significant Diagnostic Studies:  labs: UDS positive for Benzodiazepines and Barbiturates, CMET, CBC, UA, BAL, Salicylates and Acetaminophen levels non contribitory  Discharge Vitals:   Blood pressure 117/75, pulse 81, temperature 97.5 F (36.4 C), temperature source Oral, resp. rate 16, height 5\' 10"  (1.778 m), weight 66.679 kg (147 lb)..  Mental Status Exam: See Mental Status Examination and Suicide Risk Assessment completed by Attending Physician prior to discharge.  Discharge destination:  Home  Is patient on multiple antipsychotic therapies at discharge:  No  Has Patient had three or more failed trials of antipsychotic monotherapy by history: N/A Recommended Plan for Multiple Antipsychotic Therapies: N/A Discharge Orders    Future Appointments: Provider: Department: Dept Phone: Center:   07/04/2012 9:30 AM Eustaquio Boyden, MD [facility] [phone] [id number]     Medication List  As of 06/19/2012 10:35 PM   TAKE these medications      Indication    amitriptyline 25 MG tablet   Commonly known as: ELAVIL   Take 1 tablet (25 mg total) by mouth at bedtime. For pain management, depression, and insomnia.       carbamazepine 200 MG tablet   Commonly known as: TEGRETOL   Take 1 tablet (200 mg total) by mouth 2 (two) times daily. For mood control.       cloNIDine 0.1 MG tablet   Commonly known as: CATAPRES   For opiate withdrawal symptoms, Take by mouth 4 a day on Aug 1 and 2, then 2 a day for Aug 3 and 4, then stop       meloxicam 7.5 MG tablet   Commonly known as: MOBIC   Take 1 tablet (7.5 mg total) by mouth daily. For inflammation       methocarbamol 500 MG tablet   Commonly known as: ROBAXIN   Take 1 tablet (500 mg total) by mouth 3  (three) times daily. For back spasms.       nicotine 21 mg/24hr patch   Commonly known as: NICODERM CQ - dosed in mg/24 hours   Place 1 patch onto the skin every morning. For smoking cessation. Nicotine is a gateway drug and should be stopped.       pantoprazole 20 MG tablet   Commonly known as: PROTONIX   Take 1 tablet (20 mg total) by mouth 2 (two) times daily before a meal. For control of stomach acid secretion and helps GERD.       sertraline 100 MG tablet   Commonly known as: ZOLOFT   Take 1.5 tablets (150 mg total) by mouth daily. For depression. Pt takes 1.5 tab for 150 mg dose            Follow-up Information    Follow up with NA meetings. (See brochure for meeting times and locations!)         Follow-up recommendations:   Activities: Resume typical activities  Diet: Resume typical diet Other: Follow up with outpatient provider and report any side effects to out patient prescriber.  Comments:  Take all your medications as prescribed by your mental healthcare provider. Report any adverse effects and or reactions from your medicines to your outpatient provider promptly. Patient is instructed and cautioned to not engage in alcohol and or illegal drug use while on prescription medicines. In the event of worsening symptoms, patient is instructed to call the crisis hotline, 911 and or go to the nearest ED for appropriate evaluation and treatment of symptoms.  SignedDan Humphreys, Sephiroth Mcluckie 06/19/2012 10:35 PM

## 2012-07-04 ENCOUNTER — Ambulatory Visit: Payer: BC Managed Care – PPO | Admitting: Family Medicine

## 2012-08-29 ENCOUNTER — Ambulatory Visit: Payer: BC Managed Care – PPO | Admitting: Family Medicine

## 2012-08-29 DIAGNOSIS — Z0289 Encounter for other administrative examinations: Secondary | ICD-10-CM

## 2013-03-22 ENCOUNTER — Emergency Department: Payer: Self-pay | Admitting: Emergency Medicine

## 2013-03-22 LAB — COMPREHENSIVE METABOLIC PANEL
Bilirubin,Total: 0.7 mg/dL (ref 0.2–1.0)
EGFR (Non-African Amer.): >60
Glucose: 62 mg/dL — ABNORMAL LOW (ref 65–99)
Potassium: 3.5 mmol/L (ref 3.5–5.1)
SGOT(AST): 24 U/L (ref 15–37)
Total Protein: 8.3 g/dL — ABNORMAL HIGH (ref 6.4–8.2)

## 2013-03-22 LAB — DRUG SCREEN, URINE
Amphetamines, Ur Screen: POSITIVE (ref ?–1000)
Benzodiazepine, Ur Scrn: NEGATIVE (ref ?–200)
MDMA (Ecstasy)Ur Screen: NEGATIVE (ref ?–500)
Methadone, Ur Screen: NEGATIVE (ref ?–300)
Opiate, Ur Screen: NEGATIVE (ref ?–300)

## 2013-03-22 LAB — CBC
MCHC: 35 g/dL (ref 32.0–36.0)
MCV: 87 fL (ref 80–100)

## 2013-03-22 LAB — URINALYSIS, COMPLETE
Blood: NEGATIVE
Hyaline Cast: 18
Leukocyte Esterase: NEGATIVE
WBC UR: <1 /HPF (ref 0–5)

## 2013-03-22 LAB — ETHANOL: Ethanol %: <0.003 % (ref 0.000–0.080)

## 2013-12-31 ENCOUNTER — Emergency Department: Payer: Self-pay | Admitting: Emergency Medicine

## 2013-12-31 LAB — CBC WITH DIFFERENTIAL/PLATELET
BASOS ABS: 0.1 10*3/uL (ref 0.0–0.1)
BASOS PCT: 0.8 %
Eosinophil #: 0.1 10*3/uL (ref 0.0–0.7)
Eosinophil %: 1 %
HCT: 41.9 % (ref 40.0–52.0)
HGB: 14.1 g/dL (ref 13.0–18.0)
LYMPHS ABS: 3 10*3/uL (ref 1.0–3.6)
LYMPHS PCT: 30.1 %
MCH: 29 pg (ref 26.0–34.0)
MCHC: 33.6 g/dL (ref 32.0–36.0)
MCV: 86 fL (ref 80–100)
Monocyte #: 0.5 x10 3/mm (ref 0.2–1.0)
Monocyte %: 5.3 %
NEUTROS PCT: 62.8 %
Neutrophil #: 6.3 10*3/uL (ref 1.4–6.5)
Platelet: 294 10*3/uL (ref 150–440)
RBC: 4.85 10*6/uL (ref 4.40–5.90)
RDW: 12.8 % (ref 11.5–14.5)
WBC: 10 10*3/uL (ref 3.8–10.6)

## 2013-12-31 LAB — BASIC METABOLIC PANEL
Anion Gap: 4 — ABNORMAL LOW (ref 7–16)
BUN: 18 mg/dL (ref 7–18)
CO2: 33 mmol/L — AB (ref 21–32)
Calcium, Total: 9.4 mg/dL (ref 8.5–10.1)
Chloride: 103 mmol/L (ref 98–107)
Creatinine: 1.09 mg/dL (ref 0.60–1.30)
GLUCOSE: 93 mg/dL (ref 65–99)
Osmolality: 281 (ref 275–301)
Potassium: 3.6 mmol/L (ref 3.5–5.1)
Sodium: 140 mmol/L (ref 136–145)

## 2014-08-01 ENCOUNTER — Emergency Department: Payer: Self-pay | Admitting: Internal Medicine

## 2015-07-03 ENCOUNTER — Emergency Department (HOSPITAL_COMMUNITY)
Admission: EM | Admit: 2015-07-03 | Discharge: 2015-07-03 | Disposition: A | Discharge status: Home / Self Care | Payer: Self-pay | Attending: Emergency Medicine | Admitting: Emergency Medicine

## 2015-07-03 ENCOUNTER — Encounter (HOSPITAL_COMMUNITY): Payer: Self-pay | Admitting: *Deleted

## 2015-07-03 DIAGNOSIS — Z79899 Other long term (current) drug therapy: Secondary | ICD-10-CM | POA: Insufficient documentation

## 2015-07-03 DIAGNOSIS — Z720 Tobacco use: Secondary | ICD-10-CM | POA: Insufficient documentation

## 2015-07-03 DIAGNOSIS — G8929 Other chronic pain: Secondary | ICD-10-CM | POA: Insufficient documentation

## 2015-07-03 DIAGNOSIS — M25511 Pain in right shoulder: Secondary | ICD-10-CM | POA: Insufficient documentation

## 2015-07-03 DIAGNOSIS — F329 Major depressive disorder, single episode, unspecified: Secondary | ICD-10-CM | POA: Insufficient documentation

## 2015-07-03 MED ORDER — MELOXICAM 15 MG PO TABS: 15.0000 mg | ORAL_TABLET | Freq: Every day | ORAL | Status: DC

## 2015-07-03 NOTE — ED Provider Notes (Signed)
CSN: [id number]     Arrival date & time 07/03/15  1133 History  This chart was scribed for non-physician practitioner, Langston Masker, PA-C, working with Linwood Dibbles, MD, by Ronney Lion, ED Scribe. This patient was seen in room WTR5/WTR5 and the patient's care was started at 1:20 PM.    Chief Complaint  Patient presents with  . Shoulder Pain   Patient is a 29 y.o. male presenting with shoulder pain. The history is provided by the patient. No language interpreter was used.  Shoulder Pain Location:  Shoulder Shoulder location:  R shoulder Pain details:    Radiates to:  Does not radiate   Severity:  Moderate   Duration:  36 months   Progression:  Worsening Chronicity:  Chronic Foreign body present:  No foreign bodies Relieved by:  NSAIDs Worsened by:  Nothing tried Ineffective treatments:  None tried   HPI Comments: Caleb Rowe is a 29 y.o. male who presents to the Emergency Department complaining of chronic right shoulder pain that intermittently pops out of the socket at least once a week, with onset 3 years ago. Patient presents because he has lately had increased difficulty sleeping secondary to the discomfort, and the popping has become much more frequent recently. He is usually able to pop it back in himself, but is concerned that the frequent popping is making his condition worse. Patient states he has had a shoulder XR before, which showed no abnormalities at the time. He has been taking BC powder and Advil with no relief. Patient reports a history of knee arthroplasty but states he can't remember the name of the orthopedist.    Past Medical History  Diagnosis Date  . Seizures   . Depression   . Sociopathic personality disorder    Past Surgical History  Procedure Laterality Date  . Knee surgery     No family history on file. Social History  Substance Use Topics  . Smoking status: Current Every Day Smoker -- 0.25 packs/day for 10 years    Types: Cigarettes  . Smokeless  tobacco: None  . Alcohol Use: No     Comment: rarely    Review of Systems  Musculoskeletal: Positive for arthralgias (right shoulder pain).  Psychiatric/Behavioral: Positive for sleep disturbance (secondary to right shoulder pain).  All other systems reviewed and are negative.     Allergies  Review of patient's allergies indicates no known allergies.  Home Medications   Prior to Admission medications   Medication Sig Start Date End Date Taking? Authorizing Provider  ibuprofen (ADVIL,MOTRIN) 200 MG tablet Take 800 mg by mouth every 6 (six) hours as needed for moderate pain.   Yes Historical Provider, MD  amitriptyline (ELAVIL) 25 MG tablet Take 1 tablet (25 mg total) by mouth at bedtime. For pain management, depression, and insomnia. 06/12/12 06/12/13  Mike Craze, MD  carbamazepine (TEGRETOL) 200 MG tablet Take 1 tablet (200 mg total) by mouth 2 (two) times daily. For mood control. 06/12/12 06/12/13  Mike Craze, MD  cloNIDine (CATAPRES) 0.1 MG tablet For opiate withdrawal symptoms, Take by mouth 4 a day on Aug 1 and 2, then 2 a day for Aug 3 and 4, then stop Patient not taking: Reported on 07/03/2015 06/12/12   Mike Craze, MD  pantoprazole (PROTONIX) 20 MG tablet Take 1 tablet (20 mg total) by mouth 2 (two) times daily before a meal. For control of stomach acid secretion and helps GERD. 06/12/12 06/12/13  Mike Craze, MD  sertraline (ZOLOFT) 100 MG tablet Take 1.5 tablets (150 mg total) by mouth daily. For depression. Pt takes 1.5 tab for 150 mg dose Patient not taking: Reported on 07/03/2015 06/12/12   Mike Craze, MD   BP 122/63 mmHg  Pulse 84  Temp(Src) 98 F (36.7 C) (Oral)  Resp 17  SpO2 98% Physical Exam  Constitutional: He is oriented to person, place, and time. He appears well-developed and well-nourished. No distress.  HENT:  Head: Normocephalic and atraumatic.  Eyes: Conjunctivae and EOM are normal.  Neck: Neck supple. No tracheal deviation present.  Cardiovascular:  Normal rate.   Pulmonary/Chest: Effort normal. No respiratory distress.  Musculoskeletal: Normal range of motion. He exhibits tenderness.  Muscular right shoulder. Decreased ROM. Pain with abduction. No atrophy. Neurovascular and neurosensory intact.   Neurological: He is alert and oriented to person, place, and time.  Skin: Skin is warm and dry.  Psychiatric: He has a normal mood and affect. His behavior is normal.  Nursing note and vitals reviewed.   ED Course  Procedures (including critical care time)  DIAGNOSTIC STUDIES: Oxygen Saturation is 98% on RA, normal by my interpretation.    COORDINATION OF CARE: 1:22 PM - Discussed treatment plan with pt at bedside which includes mobic and wellness center Pt agreed to plan.   Labs Review Labs Reviewed - No data to display  Imaging Review No results found. I have personally reviewed and evaluated these images and lab results as part of my medical decision-making.   EKG Interpretation None      MDM  Pt advised to follow up with wellness clinic for evaluation and possible Orthopaedist referrral   Final diagnoses:  Chronic shoulder pain, right   Mobic avs Wellness   Lonia Skinner [place], [place] 07/03/15 1339  Linwood Dibbles, MD 07/03/15 [phone]

## 2015-07-03 NOTE — Discharge Instructions (Signed)

## 2015-07-03 NOTE — ED Notes (Signed)
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 Sts he cannot sleep due to pain and no comfortable position.

## 2015-10-05 IMAGING — CR DG ELBOW 2V*L*
1 series · 2 of 2 positions shown · non-contrast
Comparison: None.

CLINICAL DATA: Insulin needle broke off in the subcutaneous tissues
of the left antecubital fossa.

[Series 1: x elbow lat left · 0.14mm/px · 2 of 2 slices shown]
[im 1/2]
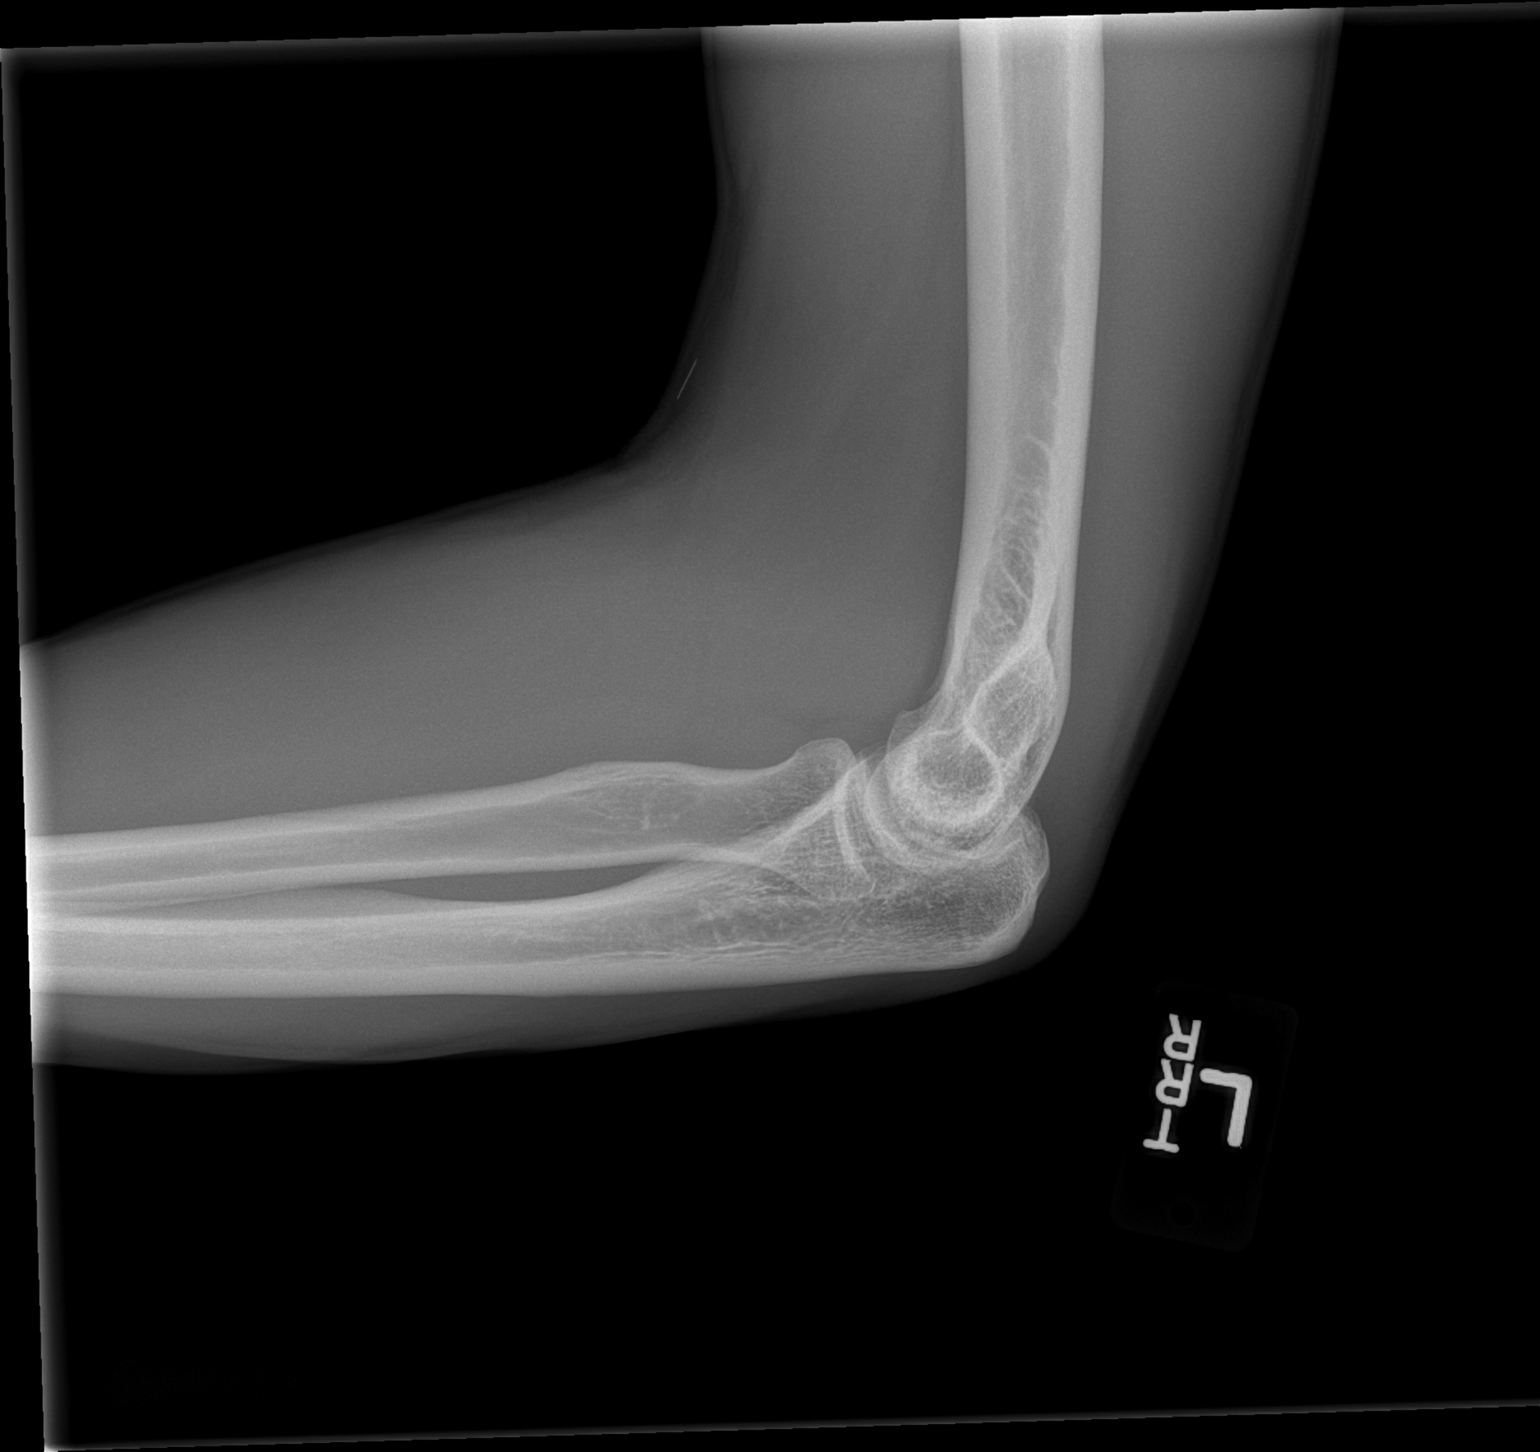
[im 2/2]
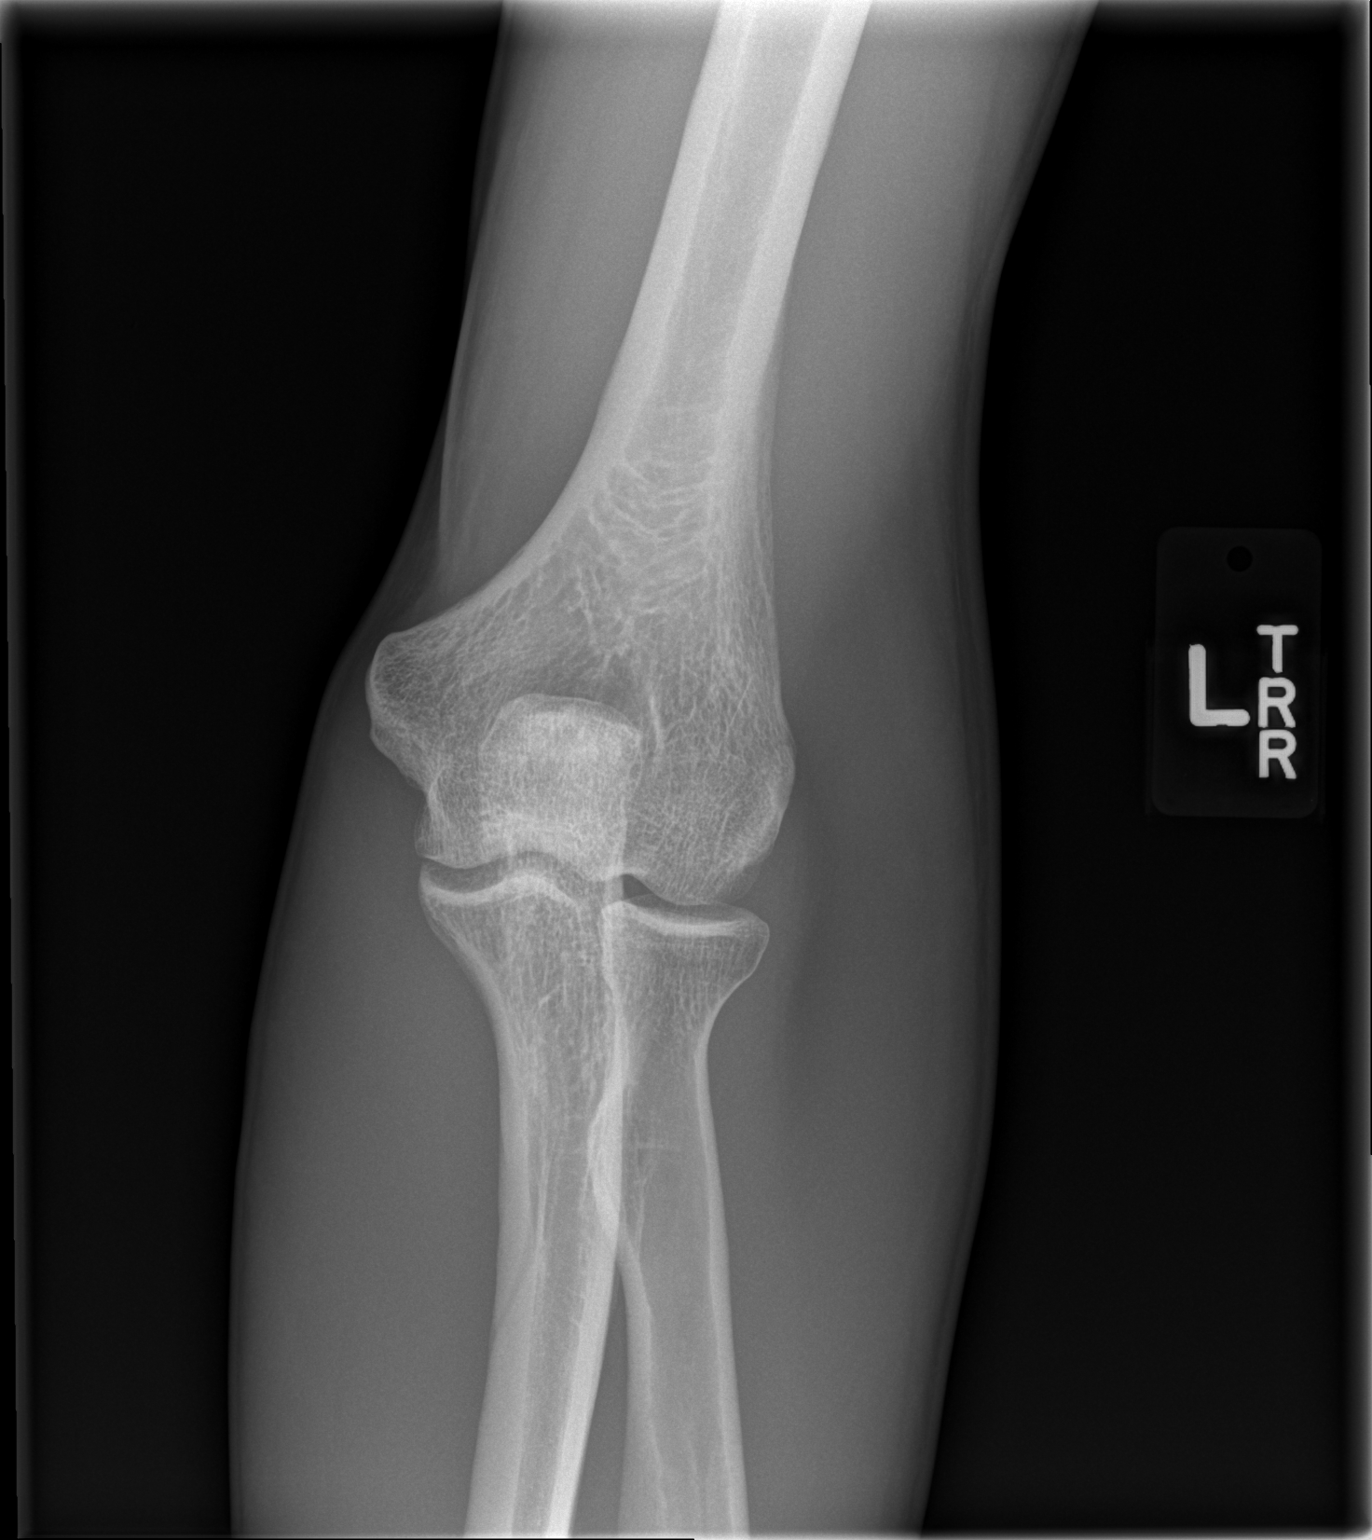

[2 of 2 positions shown; findings below may reference images not displayed]

FINDINGS: Broken needle in the subcutaneous fat of the left antecubital fossa
just above the elbow crease. No osseous abnormality.
IMPRESSION: Broken needle in the subcutaneous fat of the left antecubital fossa
just above the elbow crease.

EXAM:
LEFT ELBOW - 2 VIEW

## 2016-10-12 ENCOUNTER — Emergency Department (HOSPITAL_COMMUNITY)
Admission: EM | Admit: 2016-10-12 | Discharge: 2016-10-15 | Disposition: A | Discharge status: Home / Self Care | Payer: Self-pay | Attending: Physician Assistant | Admitting: Physician Assistant

## 2016-10-12 ENCOUNTER — Encounter (HOSPITAL_COMMUNITY): Payer: Self-pay | Admitting: Emergency Medicine

## 2016-10-12 DIAGNOSIS — F3132 Bipolar disorder, current episode depressed, moderate: Secondary | ICD-10-CM | POA: Diagnosis present

## 2016-10-12 DIAGNOSIS — F1610 Hallucinogen abuse, uncomplicated: Secondary | ICD-10-CM | POA: Diagnosis present

## 2016-10-12 DIAGNOSIS — F1994 Other psychoactive substance use, unspecified with psychoactive substance-induced mood disorder: Secondary | ICD-10-CM | POA: Diagnosis present

## 2016-10-12 DIAGNOSIS — Z79899 Other long term (current) drug therapy: Secondary | ICD-10-CM | POA: Insufficient documentation

## 2016-10-12 DIAGNOSIS — F332 Major depressive disorder, recurrent severe without psychotic features: Secondary | ICD-10-CM | POA: Insufficient documentation

## 2016-10-12 DIAGNOSIS — F17210 Nicotine dependence, cigarettes, uncomplicated: Secondary | ICD-10-CM | POA: Insufficient documentation

## 2016-10-12 LAB — CBC
HCT: 44.1 % (ref 39.0–52.0)
Hemoglobin: 16 g/dL (ref 13.0–17.0)
MCH: 30.5 pg (ref 26.0–34.0)
MCHC: 36.3 g/dL — AB (ref 30.0–36.0)
MCV: 84.2 fL (ref 78.0–100.0)
PLATELETS: 356 10*3/uL (ref 150–400)
RBC: 5.24 MIL/uL (ref 4.22–5.81)
RDW: 12.2 % (ref 11.5–15.5)
WBC: 11.5 10*3/uL — ABNORMAL HIGH (ref 4.0–10.5)

## 2016-10-12 LAB — SALICYLATE LEVEL: Salicylate Lvl: <7 mg/dL (ref 2.8–30.0)

## 2016-10-12 LAB — COMPREHENSIVE METABOLIC PANEL
ALK PHOS: 74 U/L (ref 38–126)
ALT: 16 U/L — ABNORMAL LOW (ref 17–63)
AST: 27 U/L (ref 15–41)
Albumin: 5.6 g/dL — ABNORMAL HIGH (ref 3.5–5.0)
Anion gap: 12 (ref 5–15)
BILIRUBIN TOTAL: 0.7 mg/dL (ref 0.3–1.2)
BUN: 23 mg/dL — AB (ref 6–20)
CALCIUM: 10 mg/dL (ref 8.9–10.3)
CO2: 28 mmol/L (ref 22–32)
CREATININE: 1.36 mg/dL — AB (ref 0.61–1.24)
Chloride: 99 mmol/L — ABNORMAL LOW (ref 101–111)
GFR calc Af Amer: >60 mL/min (ref >60)
Glucose, Bld: 96 mg/dL (ref 65–99)
POTASSIUM: 3.6 mmol/L (ref 3.5–5.1)
Sodium: 139 mmol/L (ref 135–145)
TOTAL PROTEIN: 9 g/dL — AB (ref 6.5–8.1)

## 2016-10-12 LAB — RAPID URINE DRUG SCREEN, HOSP PERFORMED
Amphetamines: NOT DETECTED
Barbiturates: NOT DETECTED
Benzodiazepines: NOT DETECTED
Cocaine: NOT DETECTED
Opiates: NOT DETECTED
Tetrahydrocannabinol: NOT DETECTED

## 2016-10-12 LAB — CBG MONITORING, ED: GLUCOSE-CAPILLARY: 87 mg/dL (ref 65–99)

## 2016-10-12 LAB — ETHANOL

## 2016-10-12 LAB — ACETAMINOPHEN LEVEL: Acetaminophen (Tylenol), Serum: <10 ug/mL — ABNORMAL LOW (ref 10–30)

## 2016-10-12 MED ORDER — IBUPROFEN 200 MG PO TABS: 600.0000 mg | ORAL_TABLET | Freq: Three times a day (TID) | ORAL | Status: DC | PRN

## 2016-10-12 MED ORDER — ACETAMINOPHEN 325 MG PO TABS: 650.0000 mg | ORAL_TABLET | ORAL | Status: DC | PRN

## 2016-10-12 MED ORDER — LORAZEPAM 1 MG PO TABS: 1.0000 mg | ORAL_TABLET | Freq: Three times a day (TID) | ORAL | Status: DC | PRN

## 2016-10-12 MED ORDER — ONDANSETRON HCL 4 MG PO TABS: 4.0000 mg | ORAL_TABLET | Freq: Three times a day (TID) | ORAL | Status: DC | PRN

## 2016-10-12 NOTE — ED Notes (Signed)
Patient noted in room. No complaints, stable, in no acute distress. Q15 minute rounds and monitoring via Security Cameras to continue.  

## 2016-10-12 NOTE — ED Provider Notes (Signed)
[facility]-EMERGENCY DEPT Provider Note   CSN: [id number] Arrival date & time: 10/12/16  1559     History   Chief Complaint Chief Complaint  Patient presents with  . Ingestion  . Suicidal    HPI Caleb Rowe is a 30 y.o. male.  HPI   Patient is a 30 year old male with past medical history significant for psychiatric illness, seizures, depression, drug use. Patient reports that he's been taking Molly on and off for the last 3 months. He reports that last night he tried to take an overdose of Kirt BoysMolly so that he could hurt himself. He reports that he feels like his friends are turning on him and so is his girlfriend. Patient displays paranoid thoughts. Patient's mom brought him here for fear of him hurting himself.  Past Medical History:  Diagnosis Date  . Depression   . Seizures (HCC)   . Sociopathic personality disorder     Patient Active Problem List   Diagnosis Date Noted  . Opioid dependence (HCC) 06/10/2012  . Opiate dependence, continuous (HCC) 06/10/2012    Class: Acute  . Bipolar affective disorder, depressed, moderate degree (HCC) 06/10/2012    Class: Chronic    Past Surgical History:  Procedure Laterality Date  . KNEE SURGERY         Home Medications    Prior to Admission medications   Medication Sig Start Date End Date Taking? Authorizing Provider  amitriptyline (ELAVIL) 25 MG tablet Take 1 tablet (25 mg total) by mouth at bedtime. For pain management, depression, and insomnia. 06/12/12 06/12/13  Mike CrazeEdwin O Walker, MD  carbamazepine (TEGRETOL) 200 MG tablet Take 1 tablet (200 mg total) by mouth 2 (two) times daily. For mood control. 06/12/12 06/12/13  Mike CrazeEdwin O Walker, MD  cloNIDine (CATAPRES) 0.1 MG tablet For opiate withdrawal symptoms, Take by mouth 4 a day on Aug 1 and 2, then 2 a day for Aug 3 and 4, then stop Patient not taking: Reported on 07/03/2015 06/12/12   Mike CrazeEdwin O Walker, MD  ibuprofen (ADVIL,MOTRIN) 200 MG tablet Take 800 mg by mouth every 6 (six) hours as  needed for moderate pain.    Historical Provider, MD  meloxicam (MOBIC) 15 MG tablet Take 1 tablet (15 mg total) by mouth daily. 07/03/15   Elson AreasLeslie K Sofia, PA-C  pantoprazole (PROTONIX) 20 MG tablet Take 1 tablet (20 mg total) by mouth 2 (two) times daily before a meal. For control of stomach acid secretion and helps GERD. 06/12/12 06/12/13  Mike CrazeEdwin O Walker, MD  sertraline (ZOLOFT) 100 MG tablet Take 1.5 tablets (150 mg total) by mouth daily. For depression. Pt takes 1.5 tab for 150 mg dose Patient not taking: Reported on 07/03/2015 06/12/12   Mike CrazeEdwin O Walker, MD    Family History No family history on file.  Social History Social History  Substance Use Topics  . Smoking status: Current Every Day Smoker    Packs/day: 0.25    Years: 10.00    Types: Cigarettes  . Smokeless tobacco: Current User  . Alcohol use No     Comment: rarely     Allergies   Patient has no known allergies.   Review of Systems Review of Systems  Respiratory: Negative for cough.   Psychiatric/Behavioral: Positive for agitation and behavioral problems. The patient is nervous/anxious and is hyperactive.      Physical Exam Updated Vital Signs BP 124/81 (BP Location: Left Arm)   Pulse 97   Temp 97.8 F (36.6 C) (Oral)  Resp 16   Ht 5\' 9"  (1.753 m)   Wt 160 lb (72.6 kg)   SpO2 100%   BMI 23.63 kg/m   Physical Exam  Constitutional: He is oriented to person, place, and time. He appears well-nourished.  HENT:  Head: Normocephalic.  Eyes: Conjunctivae are normal.  Cardiovascular: Normal rate and regular rhythm.   Pulmonary/Chest: Effort normal and breath sounds normal. No respiratory distress. He has no wheezes.  Neurological: He is oriented to person, place, and time.  Skin: Skin is warm and dry. He is not diaphoretic.  Sores in various stages of healing scattered throughout body.  Psychiatric: He has a normal mood and affect. His behavior is normal.     ED Treatments / Results  Labs (all labs ordered  are listed, but only abnormal results are displayed) Labs Reviewed  COMPREHENSIVE METABOLIC PANEL  ETHANOL  SALICYLATE LEVEL  ACETAMINOPHEN LEVEL  CBC  RAPID URINE DRUG SCREEN, HOSP PERFORMED  CBG MONITORING, ED    EKG  EKG Interpretation  Date/Time:  Friday October 12 2016 16:11:17 EST Ventricular Rate:  99 PR Interval:    QRS Duration: 87 QT Interval:  346 QTC Calculation: 444 R Axis:   -151 Text Interpretation:  Sinus rhythm Right axis deviation Borderline T abnormalities, lateral leads Baseline wander in lead(s) V6 wander in V4-6 limits interpretation No old tracing to compare Confirmed by GOLDSTON MD, SCOTT [phone]) on 10/12/2016 4:22:26 PM Also confirmed by Criss AlvineGOLDSTON MD, SCOTT [phone]), editor St[place] CT, Jola BabinskiMarilyn [phone]  on 10/12/2016 4:33:16 PM       Radiology No results found.  Procedures Procedures (including critical care time)  Medications Ordered in ED Medications - No data to display   Initial Impression / Assessment and Plan / ED Course  I have reviewed the triage vital signs and the nursing notes.  Pertinent labs & imaging results that were available during my care of the patient were reviewed by me and considered in my medical decision making (see chart for details).  Clinical Course     Patient is a 30 year old male with past medical history of psychiatric disorder presenting today with intent to harm himself by overdosing on Molly. Patient's mom brought him here for fear of hurting himself. Patient endorses feelings of suicidality. We'll do baseline lab work and plan to have TTS evaluate patient.  Final Clinical Impressions(s) / ED Diagnoses   Final diagnoses:  None    New Prescriptions New Prescriptions   No medications on file     Susumu Hackler Randall AnLyn Malajah Oceguera, MD 10/12/16 1704

## 2016-10-12 NOTE — ED Notes (Signed)
This patient arrived to room 37, he is very agitated and paranoid.  He has pressured speech and appears to be having audio hallucinations.  15 minute checks and video monitoring in place.

## 2016-10-12 NOTE — ED Notes (Signed)
Pt mom is taking pt belongings home [facility]

## 2016-10-12 NOTE — ED Notes (Signed)
Report to include Situation, Background, Assessment, and Recommendations received from [facility]. Patient alert and oriented, warm and dry, in no acute distress. Patient denies SI, HI, AVH and pain. Patient made aware of Q15 minute rounds and security cameras for their safety. Patient instructed to come to me with needs or concerns.

## 2016-10-12 NOTE — ED Notes (Signed)
Patient noted in room. Still and pacing in room.  No complaints, stable, in no acute distress. Q15 minute rounds and monitoring via [employer] Cam[employer] to continue.

## 2016-10-12 NOTE — ED Notes (Signed)
[facility]: [id number] Expected date:  Expected time:  Means of arrival:  Comments: HOLD for 37

## 2016-10-12 NOTE — ED Notes (Signed)
Pt. Refused Ativan.

## 2016-10-12 NOTE — ED Triage Notes (Addendum)
Pt from home stating he attempted to overdose on Molly last night. Pt states it was an attempt to end his life. Pt states he has been depressed and has not been sleeping. Pt denies other drugs or etoh. Pt is unsure how much of the drug he took, and he believes he took it this morning. Pt states he has mild abdominal pain. Pt denies emesis or diarrhea. Pt is paranoid at time of assessment. Pt's mother is at bedside

## 2016-10-12 NOTE — ED Notes (Signed)
Patient noted sleeping in room. No complaints, stable, in no acute distress. Q15 minute rounds and monitoring via Security Cameras to continue.  

## 2016-10-13 DIAGNOSIS — F1510 Other stimulant abuse, uncomplicated: Secondary | ICD-10-CM

## 2016-10-13 DIAGNOSIS — F1994 Other psychoactive substance use, unspecified with psychoactive substance-induced mood disorder: Secondary | ICD-10-CM | POA: Diagnosis present

## 2016-10-13 DIAGNOSIS — Z79899 Other long term (current) drug therapy: Secondary | ICD-10-CM

## 2016-10-13 DIAGNOSIS — F17210 Nicotine dependence, cigarettes, uncomplicated: Secondary | ICD-10-CM

## 2016-10-13 DIAGNOSIS — F1610 Hallucinogen abuse, uncomplicated: Secondary | ICD-10-CM | POA: Diagnosis present

## 2016-10-13 DIAGNOSIS — F3132 Bipolar disorder, current episode depressed, moderate: Secondary | ICD-10-CM

## 2016-10-13 MED ORDER — WHITE PETROLATUM GEL
Status: DC | PRN
  Administered 2016-10-13: 0.2 via TOPICAL
  Filled 2016-10-13: qty 28.35

## 2016-10-13 MED ORDER — QUETIAPINE FUMARATE 25 MG PO TABS
25.0000 mg | ORAL_TABLET | Freq: Every day | ORAL | Status: DC
  Administered 2016-10-13 – 2016-10-14 (×2): 25 mg via ORAL
  Filled 2016-10-13 (×2): qty 1

## 2016-10-13 NOTE — ED Notes (Signed)
Report to include situation, background, assessment and recommendations from Diane RN. Patient sleeping, respirations regular and unlabored. Q15 minute rounds and security camera observation to continue.   

## 2016-10-13 NOTE — ED Notes (Signed)
Patient noted sleeping in room. No complaints, stable, in no acute distress. Q15 minute rounds and monitoring via Security Cameras to continue.  

## 2016-10-13 NOTE — ED Notes (Signed)
Introduced self to patient/family. Pt oriented to unit expectations.  Assessed pt for:  A) Anxiety &/or agitation: Pt is anxious, irritable and a little paranoid. He said that he has been using [place] and it caused him to be paranoid and imagine things with his friends that are not really happening. He would like to stop using Molly, and he would like to stay in the hospital for a few days to recover from the effects that he continues to feel, like the paranoid, extreme hunger from not eating, and fatigue from not sleeping.   S) Safety: Safety maintained with q-15-minute checks and hourly rounds by staff.  A) ADLs: Pt able to perform ADLs independently.  P) Pick-Up (room cleanliness): Pt's room clean and free of clutter.

## 2016-10-13 NOTE — BH Assessment (Signed)
Tele Assessment Note   Caleb Rowe is an 30 y.o. male.  -Clinician reviewed note of Dr. Corlis LeakMacKuen.  Patient admitted that he attempted to overdose on molly last night.  Pt states that it was an attempt to end his life.  Pt unsure of how much he took.  Patient says he feels his friends are turning on him.  [facility] fear he may harm himself.  Patient's TTS consult had been withdrawn initially due to patient's paranoia.  This clinician went ahead and saw patient.  Patient awakened without problems.  He is agitated about some questions but does answer.  Patient denies he wants to kill himself.  He says HI "comes and goes."  Patient denies current A/V hallucinations but says he has them when taking molly.  Patient is unclear about how much molly he takes but says it has been every other day for the last 2 months.  He says it had been less the month before.  Patient endorses use of cocaine and THC but UDS is clear.  Patient says he does not drink.  Patient does evidence some paranoia.  He feels that family is lying to him and wishes him harm although he cannot explain this feeling.  Patient says girlfriend is not really a girlfriend.  When patient was informed it was Saturday, he said "why did the staff tell me it was Thursday?"  He then questioned why the television was on earlier.  Patient says "Oh I see what is going on" but cannot explain it.  Patient has no current outpatient provider.  [facility] for SA issues.  -Clinician discussed patient care with Nira ConnJason Berry, FNP who recommends inpatient care criteria due to intention to harm self with overdose.  TTS to seek placement.  Diagnosis: Substance induced mood d/o; MDD recurrent, severe  Past Medical History:  Past Medical History:  Diagnosis Date  . Depression   . Seizures (HCC)   . Sociopathic personality disorder     Past Surgical History:  Procedure Laterality Date  . KNEE SURGERY       Family History: No family history on file.  Social History:  reports that he has been smoking Cigarettes.  He has a 2.50 pack-year smoking history. He uses smokeless tobacco. He reports that he does not drink alcohol or use drugs.  Additional Social History:  Alcohol / Drug Use Pain Medications: See PTA medication list Prescriptions: See PTA medication list Over the Counter: See PTA medication list History of alcohol / drug use?: Yes Substance #1 Name of Substance 1: "Molly" 1 - Age of First Use: Unknown 1 - Amount (size/oz): Amount varies 1 - Frequency: Every other day for last 2 months 1 - Duration: Last three months steadily 1 - Last Use / Amount: 12/01  Amount unknown  CIWA: CIWA-Ar BP: 111/57 Pulse Rate: 61 COWS:    PATIENT STRENGTHS: (choose at least two) Average or above average intelligence Capable of independent living Communication skills Supportive family/friends  Allergies: No Known Allergies  Home Medications:  (Not in a hospital admission)  OB/GYN Status:  No LMP for male patient.  General Assessment Data Location of Assessment: [facility] TTS Assessment: In system Is this a Tele or Face-to-Face Assessment?: Face-to-Face Is this an Initial Assessment or a Re-assessment for this encounter?: Initial Assessment Marital status: Single Is patient pregnant?: No Pregnancy Status: No Living Arrangements: Other (Comment) (Pt says "I don't know now.") Can  pt return to current living arrangement?: Yes Admission Status: Voluntary Is patient capable of signing voluntary admission?: Yes Referral Source: Self/Family/Friend Insurance type: self pay     Crisis Care Plan Living Arrangements: Other (Comment) (Pt says "I don't know now.") Name of Psychiatrist: None Name of Therapist: None  Education Status Is patient currently in school?: No Highest grade of school patient has completed: Unknown  Risk to self with the past 6 months Suicidal Ideation: No-Not  Currently/Within Last 6 Months Has patient been a risk to self within the past 6 months prior to admission? : Yes Suicidal Intent: No-Not Currently/Within Last 6 Months Has patient had any suicidal intent within the past 6 months prior to admission? : Yes Is patient at risk for suicide?: Yes Suicidal Plan?: Yes-Currently Present Has patient had any suicidal plan within the past 6 months prior to admission? : Yes (Pt told Dr. Vinnie Level that he took [place] with intent to harm se) Specify Current Suicidal Plan: Had said he would overdose on molly. Access to Means: Yes Specify Access to Suicidal Means: Street drugs What has been your use of drugs/alcohol within the last 12 months?: Molly, cocaine, THC Previous Attempts/Gestures: No How many times?: 0 Other Self Harm Risks: None Triggers for Past Attempts: None known Intentional Self Injurious Behavior: None Family Suicide History: No Recent stressful life event(s): Conflict (Comment) (Pt says conflict within family.) Persecutory voices/beliefs?: Yes Depression: Yes Depression Symptoms: Feeling angry/irritable Substance abuse history and/or treatment for substance abuse?: Yes Suicide prevention information given to non-admitted patients: Not applicable  Risk to Others within the past 6 months Homicidal Ideation: No-Not Currently/Within Last 6 Months Does patient have any lifetime risk of violence toward others beyond the six months prior to admission? : No Thoughts of Harm to Others: No-Not Currently Present/Within Last 6 Months Current Homicidal Intent: No Current Homicidal Plan: No Access to Homicidal Means: No Identified Victim: No one History of harm to others?: No Assessment of Violence: None Noted Violent Behavior Description: Pt denies Does patient have access to weapons?: No Criminal Charges Pending?: No Does patient have a court date: No Is patient on probation?: Yes (Unsupervised probation)  Psychosis Hallucinations: None  noted (Pt says "not when I'm on molly.") Delusions: Persecutory  Mental Status Report Appearance/Hygiene: Disheveled, In scrubs Eye Contact: Fair Motor Activity: Freedom of movement, Unremarkable Speech: Argumentative Level of Consciousness: Alert, Irritable Mood: Anxious, Apprehensive Affect: Angry, Anxious Anxiety Level: Moderate Thought Processes: Coherent Judgement: Unimpaired Orientation: Person, Place, Situation Obsessive Compulsive Thoughts/Behaviors: None  Cognitive Functioning Concentration: Decreased Memory: Remote Intact, Recent Impaired IQ: Average Insight: Poor Impulse Control: Poor Appetite: Good Weight Loss: 0 Weight Gain: 0 Sleep: Decreased Total Hours of Sleep:  (<4H/D over last 4 days.) Vegetative Symptoms: None  ADLScreening [facility] Assessment Services) Patient's cognitive ability adequate to safely complete daily activities?: Yes Patient able to express need for assistance with ADLs?: Yes Independently performs ADLs?: Yes (appropriate for developmental age)  Prior Inpatient Therapy Prior Inpatient Therapy: Yes Prior Therapy Dates: 2013 Prior Therapy Facilty/Provider(s): [facility] Reason for Treatment: SA  Prior Outpatient Therapy Prior Outpatient Therapy: No Prior Therapy Dates: None Prior Therapy Facilty/Provider(s): None Reason for Treatment: None Does patient have an ACCT team?: No Does patient have Intensive In-House Services?  : No Does patient have [facility] services? : No Does patient have P4CC services?: No  ADL Screening (condition at time of admission) Patient's cognitive ability adequate to safely complete daily activities?: Yes Is the patient deaf or have difficulty hearing?: No  Does the patient have difficulty seeing, even when wearing glasses/contacts?: No Does the patient have difficulty concentrating, remembering, or making decisions?: Yes Patient able to express need for assistance with ADLs?: Yes Does the patient have difficulty  dressing or bathing?: No Independently performs ADLs?: Yes (appropriate for developmental age) Does the patient have difficulty walking or climbing stairs?: No Weakness of Legs: None Weakness of Arms/Hands: None       Abuse/Neglect Assessment (Assessment to be complete while patient is alone) Physical Abuse: Denies Verbal Abuse: Denies Sexual Abuse: Denies Exploitation of patient/patient's resources: Denies Self-Neglect: Denies     Merchant navy officerAdvance Directives (For Healthcare) Does Patient Have a Medical Advance Directive?: No    Additional Information 1:1 In Past 12 Months?: No CIRT Risk: No Elopement Risk: No Does patient have medical clearance?: Yes     Disposition:  Disposition Initial Assessment Completed for this Encounter: Yes Disposition of Patient: Other dispositions (Pt to be reviewed with PA) Other disposition(s): Other (Comment) (To be reviewed with NP)  Beatriz StallionHarvey, Caleb Rowe 10/13/2016 7:24 AM

## 2016-10-13 NOTE — Consult Note (Signed)
Caleb Psychiatry Consult   Reason for Consult:  Substance abuse with paranoid thoughts while intoxicated  Referring Physician:  EDP Patient Identification: Caleb Rowe MRN:  [id number] Principal Diagnosis: Substance induced mood disorder ([place]) Diagnosis:   Patient Active Problem List   Diagnosis Date Noted  . 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 Substance induced mood disorder ([place]) [F19.94] 10/13/2016    Priority: High  . Bipolar affective disorder, depressed, moderate degree ([place]) [F31.32] 06/10/2012    Priority: High    Class: Chronic  . Opioid dependence ([place]) [F11.20] 06/10/2012  . Opiate dependence, continuous ([place]) [F11.20] 06/10/2012    Class: Acute    Total Time spent with patient: 25 minutes   Subjective:   Caleb Rowe is a 30 y.o. male patient admitted with reports of paranoid ideation after consuming MDMA (molly). Pt seen and chart reviewed. Pt is alert/oriented x4, calm, cooperative, and appropriate to situation. Pt denies suicidal/homicidal ideation and psychosis and does not appear to be responding to internal stimuli. However, pt does report that he felt very paranoid when he was high on this drug and that he wanted to "let my mind reset so I don't feel that way."  HPI:  I have reviewed and concur with HPI elements below, modified as follows:  "Caleb Rowe is an 30 y.o. male. Clinician reviewed note of Dr. Thomasene Lot.  Patient admitted that he attempted to overdose on molly last night.  Pt states that it was an attempt to end his life.  Pt unsure of how much he took.  Patient says he feels his friends are turning on him.  Mother brought him [facility] out of fear he may harm himself. Patient's TTS consult had been withdrawn initially due to patient's paranoia.  This clinician went ahead and saw patient.  Patient awakened without problems.  He is agitated about some questions but does answer.  Patient denies he wants to kill himself.  He  says HI "comes and goes."  Patient denies current A/V hallucinations but says he has them when taking molly.Patient is unclear about how much molly he takes but says it has been every other day for the last 2 months.  He says it had been less the month before.  Patient endorses use of cocaine and THC but UDS is clear.  Patient says he does not drink.   Patient does evidence some paranoia.  He feels that family is lying to him and wishes him harm although he cannot explain this feeling.  Patient says girlfriend is not really a girlfriend.  When patient was informed it was Saturday, he said "why did the staff tell me it was Thursday?"  He then questioned why the television was on earlier.  Patient says "Oh I see what is going on" but cannot explain it."  Today on 10/13/2016 pt seen for psych evaluation. Pt has been cooperative with ED staff members and appropriate although continues to endorse some paranoid ideation which he attributes to his drug use. Will monitor overnight to observe continued improvement of symptoms.   Past Psychiatric History: drug abuse, depression  Risk to Self: Suicidal Ideation: No-Not Currently/Within Last 6 Months Suicidal Intent: No-Not Currently/Within Last 6 Months Is patient at risk for suicide?: Yes Suicidal Plan?: Yes-Currently Present Specify Current Suicidal Plan: Had said he would overdose on molly. Access to Means: Yes Specify Access to Suicidal Means: Street drugs What has been your use of drugs/alcohol within the last  12 months?: Molly, cocaine, THC How many times?: 0 Other Self Harm Risks: None Triggers for Past Attempts: None known Intentional Self Injurious Behavior: None Risk to Others: Homicidal Ideation: No-Not Currently/Within Last 6 Months Thoughts of Harm to Others: No-Not Currently Present/Within Last 6 Months Current Homicidal Intent: No Current Homicidal Plan: No Access to Homicidal Means: No Identified Victim: No one History of harm to  others?: No Assessment of Violence: None Noted Violent Behavior Description: Pt denies Does patient have access to weapons?: No Criminal Charges Pending?: No Does patient have a court date: No Prior Inpatient Therapy: Prior Inpatient Therapy: Yes Prior Therapy Dates: 2013 Prior Therapy Facilty/Provider(s): [facility] Reason for Treatment: SA Prior Outpatient Therapy: Prior Outpatient Therapy: No Prior Therapy Dates: None Prior Therapy Facilty/Provider(s): None Reason for Treatment: None Does patient have an ACCT team?: No Does patient have Intensive In-House Services?  : No Does patient have [facility] services? : No Does patient have P4CC services?: No  Past Medical History:  Past Medical History:  Diagnosis Date  . Depression   . Seizures ([place])   . Sociopathic personality disorder     Past Surgical History:  Procedure Laterality Date  . KNEE SURGERY     Family History: No family history on file. Family Psychiatric  History: denies Social History:  History  Alcohol Use No    Comment: rarely     History  Drug Use No    Comment: pt denies (07/03/15) but previously documented    Social History   Social History  . Marital status: Unknown    Spouse name: N/A  . Number of children: N/A  . Years of education: N/A   Social History Main Topics  . Smoking status: Current Every Day Smoker    Packs/day: 0.25    Years: 10.00    Types: Cigarettes  . Smokeless tobacco: Current User  . Alcohol use No     Comment: rarely  . Drug use: No     Comment: pt denies (07/03/15) but previously documented  . Sexual activity: Yes   Other Topics Concern  . None   Social History Narrative  . None   Additional Social History:    Allergies:  No Known Allergies  Labs:  Results for orders placed or performed during the hospital encounter of 10/12/16 (from the past 48 hour(s))  Comprehensive metabolic panel     Status: Abnormal   Collection Time: 10/12/16  4:47 PM  Result Value Ref  Range   Sodium 139 135 - 145 mmol/L   Potassium 3.6 3.5 - 5.1 mmol/L   Chloride 99 (L) 101 - 111 mmol/L   CO2 28 22 - 32 mmol/L   Glucose, Bld 96 65 - 99 mg/dL   BUN 23 (H) 6 - 20 mg/dL   Creatinine, Ser 1.36 (H) 0.61 - 1.24 mg/dL   Calcium 10.0 8.9 - 10.3 mg/dL   Total Protein 9.0 (H) 6.5 - 8.1 g/dL   Albumin 5.6 (H) 3.5 - 5.0 g/dL   AST 27 15 - 41 U/L   ALT 16 (L) 17 - 63 U/L   Alkaline Phosphatase 74 38 - 126 U/L   Total Bilirubin 0.7 0.3 - 1.2 mg/dL   GFR calc non Af Amer >60 >60 mL/min   GFR calc Af Amer >60 >60 mL/min    Comment: (NOTE) The eGFR has been calculated using the CKD EPI equation. This calculation has not been validated in all clinical situations. eGFR's persistently <60 mL/min signify possible Chronic Kidney  Disease.    Anion gap 12 5 - 15  Ethanol     Status: None   Collection Time: 10/12/16  4:47 PM  Result Value Ref Range   Alcohol, Ethyl (B) <5 <5 mg/dL    Comment:        LOWEST DETECTABLE LIMIT FOR SERUM ALCOHOL IS 5 mg/dL FOR MEDICAL PURPOSES ONLY   Salicylate level     Status: None   Collection Time: 10/12/16  4:47 PM  Result Value Ref Range   Salicylate Lvl <8.2 2.8 - 30.0 mg/dL  Acetaminophen level     Status: Abnormal   Collection Time: 10/12/16  4:47 PM  Result Value Ref Range   Acetaminophen (Tylenol), Serum <10 (L) 10 - 30 ug/mL    Comment:        THERAPEUTIC CONCENTRATIONS VARY SIGNIFICANTLY. A RANGE OF 10-30 ug/mL MAY BE AN EFFECTIVE CONCENTRATION FOR MANY PATIENTS. HOWEVER, SOME ARE BEST TREATED AT CONCENTRATIONS OUTSIDE THIS RANGE. ACETAMINOPHEN CONCENTRATIONS >150 ug/mL AT 4 HOURS AFTER INGESTION AND >50 ug/mL AT 12 HOURS AFTER INGESTION ARE OFTEN ASSOCIATED WITH TOXIC REACTIONS.   cbc     Status: Abnormal   Collection Time: 10/12/16  4:47 PM  Result Value Ref Range   WBC 11.5 (H) 4.0 - 10.5 K/uL   RBC 5.24 4.22 - 5.81 MIL/uL   Hemoglobin 16.0 13.0 - 17.0 g/dL   HCT 44.1 39.0 - 52.0 %   MCV 84.2 78.0 - 100.0 fL   MCH  30.5 26.0 - 34.0 pg   MCHC 36.3 (H) 30.0 - 36.0 g/dL   RDW 12.2 11.5 - 15.5 %   Platelets 356 150 - 400 K/uL  Rapid urine drug screen (hospital performed)     Status: None   Collection Time: 10/12/16  4:50 PM  Result Value Ref Range   Opiates NONE DETECTED NONE DETECTED   Cocaine NONE DETECTED NONE DETECTED   Benzodiazepines NONE DETECTED NONE DETECTED   Amphetamines NONE DETECTED NONE DETECTED   Tetrahydrocannabinol NONE DETECTED NONE DETECTED   Barbiturates NONE DETECTED NONE DETECTED    Comment:        DRUG SCREEN FOR MEDICAL PURPOSES ONLY.  IF CONFIRMATION IS NEEDED FOR ANY PURPOSE, NOTIFY LAB WITHIN 5 DAYS.        LOWEST DETECTABLE LIMITS FOR URINE DRUG SCREEN Drug Class       Cutoff (ng/mL) Amphetamine      1000 Barbiturate      200 Benzodiazepine   956 Tricyclics       213 Opiates          300 Cocaine          300 THC              50   CBG monitoring, ED     Status: None   Collection Time: 10/12/16  5:19 PM  Result Value Ref Range   Glucose-Capillary 87 65 - 99 mg/dL    Current Facility-Administered Medications  Medication Dose Route Frequency Provider Last Rate Last Dose  . acetaminophen (TYLENOL) tablet 650 mg  650 mg Oral Q4H PRN Courteney Lyn Mackuen, MD      . ibuprofen (ADVIL,MOTRIN) tablet 600 mg  600 mg Oral Q8H PRN Courteney Lyn Mackuen, MD      . LORazepam (ATIVAN) tablet 1 mg  1 mg Oral Q8H PRN Courteney Lyn Mackuen, MD      . ondansetron (ZOFRAN) tablet 4 mg  4 mg Oral Q8H PRN Courteney Lyn Mackuen, MD      .  white petrolatum (VASELINE) gel   Topical PRN Benjamine Mola, FNP       Current Outpatient Prescriptions  Medication Sig Dispense Refill  . Aspirin-Salicylamide-Caffeine (BC HEADACHE POWDER PO) Take 1 packet by mouth daily as needed (pain).    Marland Kitchen ibuprofen (ADVIL,MOTRIN) 200 MG tablet Take 800-1,200 mg by mouth every 6 (six) hours as needed for moderate pain.    Marland Kitchen amitriptyline (ELAVIL) 25 MG tablet Take 1 tablet (25 mg total) by mouth at  bedtime. For pain management, depression, and insomnia. 30 tablet 0  . carbamazepine (TEGRETOL) 200 MG tablet Take 1 tablet (200 mg total) by mouth 2 (two) times daily. For mood control. 60 tablet 0  . cloNIDine (CATAPRES) 0.1 MG tablet For opiate withdrawal symptoms, Take by mouth 4 a day on Aug 1 and 2, then 2 a day for Aug 3 and 4, then stop (Patient not taking: Reported on 10/12/2016) 10 tablet 0  . ibuprofen (ADVIL,MOTRIN) 200 MG tablet Take 800 mg by mouth every 6 (six) hours as needed for moderate pain.    . meloxicam (MOBIC) 15 MG tablet Take 1 tablet (15 mg total) by mouth daily. (Patient not taking: Reported on 10/12/2016) 30 tablet 1  . pantoprazole (PROTONIX) 20 MG tablet Take 1 tablet (20 mg total) by mouth 2 (two) times daily before a meal. For control of stomach acid secretion and helps GERD. 30 tablet 0  . sertraline (ZOLOFT) 100 MG tablet Take 1.5 tablets (150 mg total) by mouth daily. For depression. Pt takes 1.5 tab for 150 mg dose (Patient not taking: Reported on 10/12/2016) 45 tablet 0    Musculoskeletal: Strength & Muscle Tone: within normal limits Gait & Station: normal Patient leans: N/A  Psychiatric Specialty Exam: Physical Exam  Review of Systems  Psychiatric/Behavioral: Positive for depression and substance abuse. Negative for hallucinations and suicidal ideas. The patient is nervous/anxious and has insomnia.   All other systems reviewed and are negative.   Blood pressure 111/57, pulse 61, temperature 98.3 F (36.8 C), temperature source Oral, resp. rate 16, height '5\' 9"'$  (1.753 m), weight 72.6 kg (160 lb), SpO2 99 %.Body mass index is 23.63 kg/m.  General Appearance: Casual and Fairly Groomed  Eye Contact:  Good  Speech:  Clear and Coherent and Normal Rate  Volume:  Normal  Mood:  Anxious and Depressed  Affect:  Appropriate, Congruent and Depressed  Thought Process:  Coherent, Goal Directed, Linear and Descriptions of Associations: Intact  Orientation:  Full  (Time, Place, and Person)  Thought Content:  Symptoms, worries, concerns, drug use, family situations   Suicidal Thoughts:  No  Homicidal Thoughts:  No  Memory:  Immediate;   Fair Recent;   Fair Remote;   Fair  Judgement:  Fair  Insight:  Fair  Psychomotor Activity:  Normal  Concentration:  Concentration: Good and Attention Span: Good  Recall:  [employer] of Knowledge:  Fair  Language:  Fair  Akathisia:  No  Handed:    AIMS (if indicated):     Assets:  Communication Skills Desire for Improvement Resilience Social Support  ADL's:  Intact  Cognition:  WNL  Sleep:      Treatment Plan Summary: Substance induced mood disorder ([place]) improving, but will need to observe overnight to determine best disposition, managed as below:  Medications:  -Seroquel '25mg'$  po qhs  Disposition: Observe overnight to determine if this is acute vs. Chronic vs. Substance-induced  Benjamine Mola, FNP 10/13/2016 12:09 PM   Patient  seen, chart reviewed and case discussed with the physician extender and formulated treatment plan.Reviewed the information documented and agree with the treatment plan.  Aivah Putman [employer] 10/13/2016 3:29 PM

## 2016-10-13 NOTE — ED Notes (Signed)
Patient noted in room. No complaints, stable, in no acute distress. Q15 minute rounds and monitoring via Security Cameras to continue.  

## 2016-10-14 NOTE — ED Notes (Signed)
Patient noted sleeping in room. No complaints, stable, in no acute distress. Q15 minute rounds and monitoring via Security Cameras to continue.  

## 2016-10-14 NOTE — ED Notes (Signed)
Patient noted in room. No complaints, stable, in no acute distress. Q15 minute rounds and monitoring via Security Cameras to continue.  

## 2016-10-14 NOTE — Consult Note (Signed)
[facility] Consult   Reason for Consult:  Substance abuse with paranoid thoughts while intoxicated  Referring Physician:  EDP Patient Identification: Caleb Rowe MRN:  [id number] Principal Diagnosis: Substance induced mood disorder ([place]) Diagnosis:   Patient Active Problem List   Diagnosis Date Noted  . 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 Substance induced mood disorder ([place]) [F19.94] 10/13/2016    Priority: High  . Bipolar affective disorder, depressed, moderate degree ([place]) [F31.32] 06/10/2012    Priority: High    Class: Chronic  . Opioid dependence ([place]) [F11.20] 06/10/2012  . Opiate dependence, continuous ([place]) [F11.20] 06/10/2012    Class: Acute    Total Time spent with patient: 25 minutes   Subjective:   Caleb Rowe is a 30 y.o. male patient admitted with reports of paranoid ideation after consuming MDMA (molly). Pt seen yesterday and was upset but reported he was coming down from being high. Today on 10/14/2016 , pt seen and chart reviewed. Pt is alert/oriented x4, calm, cooperative, and appropriate to situation. Pt denies suicidal/homicidal ideation and psychosis and does not appear to be responding to internal stimuli. Pt has been considering the benefits of inpatient rehabilitation but would like to discuss this with his father.   Update: Pt decided to sign in voluntarily per Dr. Parke Poisson' recommendation.   HPI:  I have reviewed and concur with HPI elements below, modified as follows:  "Caleb Rowe is an 30 y.o. male. Clinician reviewed note of Dr. Thomasene Lot.  Patient admitted that he attempted to overdose on molly last night.  Pt states that it was an attempt to end his life.  Pt unsure of how much he took.  Patient says he feels his friends are turning on him.  Mother brought him to [facility] out of fear he may harm himself. Patient's TTS consult had been withdrawn initially due to patient's paranoia.  This clinician went ahead and saw  patient.  Patient awakened without problems.  He is agitated about some questions but does answer.  Patient denies he wants to kill himself.  He says HI "comes and goes."  Patient denies current A/V hallucinations but says he has them when taking molly.Patient is unclear about how much molly he takes but says it has been every other day for the last 2 months.  He says it had been less the month before.  Patient endorses use of cocaine and THC but UDS is clear.  Patient says he does not drink.   Patient does evidence some paranoia.  He feels that family is lying to him and wishes him harm although he cannot explain this feeling.  Patient says girlfriend is not really a girlfriend.  When patient was informed it was Saturday, he said "why did the staff tell me it was Thursday?"  He then questioned why the television was on earlier.  Patient says "Oh I see what is going on" but cannot explain it."  Today on 10/14/2016 pt seen for psych evaluation again. The original plan was to discharge today if pt was doing well. However, pt has been considering the merits of inpatient rehabilitation to get clean from drug use. See evaluation above.   Past Psychiatric History: drug abuse, depression  Risk to Self: Suicidal Ideation: No-Not Currently/Within Last 6 Months Suicidal Intent: No-Not Currently/Within Last 6 Months Is patient at risk for suicide?: Yes Suicidal Plan?: Yes-Currently Present Specify Current Suicidal Plan: Had said he would overdose on molly. Access to  Means: Yes Specify Access to Suicidal Means: Street drugs What has been your use of drugs/alcohol within the last 12 months?: Molly, cocaine, THC How many times?: 0 Other Self Harm Risks: None Triggers for Past Attempts: None known Intentional Self Injurious Behavior: None Risk to Others: Homicidal Ideation: No-Not Currently/Within Last 6 Months Thoughts of Harm to Others: No-Not Currently Present/Within Last 6 Months Current Homicidal  Intent: No Current Homicidal Plan: No Access to Homicidal Means: No Identified Victim: No one History of harm to others?: No Assessment of Violence: None Noted Violent Behavior Description: Pt denies Does patient have access to weapons?: No Criminal Charges Pending?: No Does patient have a court date: No Prior Inpatient Therapy: Prior Inpatient Therapy: Yes Prior Therapy Dates: 2013 Prior Therapy Facilty/Provider(s): [facility] Reason for Treatment: SA Prior Outpatient Therapy: Prior Outpatient Therapy: No Prior Therapy Dates: None Prior Therapy Facilty/Provider(s): None Reason for Treatment: None Does patient have an ACCT team?: No Does patient have Intensive In-House Services?  : No Does patient have [facility] services? : No Does patient have P4CC services?: No  Past Medical History:  Past Medical History:  Diagnosis Date  . Depression   . Seizures ([place])   . Sociopathic personality disorder     Past Surgical History:  Procedure Laterality Date  . KNEE SURGERY     Family History: No family history on file. Family Psychiatric  History: denies Social History:  History  Alcohol Use No    Comment: rarely     History  Drug Use No    Comment: pt denies (07/03/15) but previously documented    Social History   Social History  . Marital status: Unknown    Spouse name: N/A  . Number of children: N/A  . Years of education: N/A   Social History Main Topics  . Smoking status: Current Every Day Smoker    Packs/day: 0.25    Years: 10.00    Types: Cigarettes  . Smokeless tobacco: Current User  . Alcohol use No     Comment: rarely  . Drug use: No     Comment: pt denies (07/03/15) but previously documented  . Sexual activity: Yes   Other Topics Concern  . None   Social History Narrative  . None   Additional Social History:    Allergies:  No Known Allergies  Labs:  Results for orders placed or performed during the hospital encounter of 10/12/16 (from the past 48  hour(s))  Comprehensive metabolic panel     Status: Abnormal   Collection Time: 10/12/16  4:47 PM  Result Value Ref Range   Sodium 139 135 - 145 mmol/L   Potassium 3.6 3.5 - 5.1 mmol/L   Chloride 99 (L) 101 - 111 mmol/L   CO2 28 22 - 32 mmol/L   Glucose, Bld 96 65 - 99 mg/dL   BUN 23 (H) 6 - 20 mg/dL   Creatinine, Ser 1.36 (H) 0.61 - 1.24 mg/dL   Calcium 10.0 8.9 - 10.3 mg/dL   Total Protein 9.0 (H) 6.5 - 8.1 g/dL   Albumin 5.6 (H) 3.5 - 5.0 g/dL   AST 27 15 - 41 U/L   ALT 16 (L) 17 - 63 U/L   Alkaline Phosphatase 74 38 - 126 U/L   Total Bilirubin 0.7 0.3 - 1.2 mg/dL   GFR calc non Af Amer >60 >60 mL/min   GFR calc Af Amer >60 >60 mL/min    Comment: (NOTE) The eGFR has been calculated using the CKD EPI  equation. This calculation has not been validated in all clinical situations. eGFR's persistently <60 mL/min signify possible Chronic Kidney Disease.    Anion gap 12 5 - 15  Ethanol     Status: None   Collection Time: 10/12/16  4:47 PM  Result Value Ref Range   Alcohol, Ethyl (B) <5 <5 mg/dL    Comment:        LOWEST DETECTABLE LIMIT FOR SERUM ALCOHOL IS 5 mg/dL FOR MEDICAL PURPOSES ONLY   Salicylate level     Status: None   Collection Time: 10/12/16  4:47 PM  Result Value Ref Range   Salicylate Lvl <8.5 2.8 - 30.0 mg/dL  Acetaminophen level     Status: Abnormal   Collection Time: 10/12/16  4:47 PM  Result Value Ref Range   Acetaminophen (Tylenol), Serum <10 (L) 10 - 30 ug/mL    Comment:        THERAPEUTIC CONCENTRATIONS VARY SIGNIFICANTLY. A RANGE OF 10-30 ug/mL MAY BE AN EFFECTIVE CONCENTRATION FOR MANY PATIENTS. HOWEVER, SOME ARE BEST TREATED AT CONCENTRATIONS OUTSIDE THIS RANGE. ACETAMINOPHEN CONCENTRATIONS >150 ug/mL AT 4 HOURS AFTER INGESTION AND >50 ug/mL AT 12 HOURS AFTER INGESTION ARE OFTEN ASSOCIATED WITH TOXIC REACTIONS.   cbc     Status: Abnormal   Collection Time: 10/12/16  4:47 PM  Result Value Ref Range   WBC 11.5 (H) 4.0 - 10.5 K/uL   RBC  5.24 4.22 - 5.81 MIL/uL   Hemoglobin 16.0 13.0 - 17.0 g/dL   HCT 44.1 39.0 - 52.0 %   MCV 84.2 78.0 - 100.0 fL   MCH 30.5 26.0 - 34.0 pg   MCHC 36.3 (H) 30.0 - 36.0 g/dL   RDW 12.2 11.5 - 15.5 %   Platelets 356 150 - 400 K/uL  Rapid urine drug screen (hospital performed)     Status: None   Collection Time: 10/12/16  4:50 PM  Result Value Ref Range   Opiates NONE DETECTED NONE DETECTED   Cocaine NONE DETECTED NONE DETECTED   Benzodiazepines NONE DETECTED NONE DETECTED   Amphetamines NONE DETECTED NONE DETECTED   Tetrahydrocannabinol NONE DETECTED NONE DETECTED   Barbiturates NONE DETECTED NONE DETECTED    Comment:        DRUG SCREEN FOR MEDICAL PURPOSES ONLY.  IF CONFIRMATION IS NEEDED FOR ANY PURPOSE, NOTIFY LAB WITHIN 5 DAYS.        LOWEST DETECTABLE LIMITS FOR URINE DRUG SCREEN Drug Class       Cutoff (ng/mL) Amphetamine      1000 Barbiturate      200 Benzodiazepine   462 Tricyclics       703 Opiates          300 Cocaine          300 THC              50   CBG monitoring, ED     Status: None   Collection Time: 10/12/16  5:19 PM  Result Value Ref Range   Glucose-Capillary 87 65 - 99 mg/dL    Current Facility-Administered Medications  Medication Dose Route Frequency Provider Last Rate Last Dose  . acetaminophen (TYLENOL) tablet 650 mg  650 mg Oral Q4H PRN Courteney Lyn Mackuen, MD      . ibuprofen (ADVIL,MOTRIN) tablet 600 mg  600 mg Oral Q8H PRN Courteney Lyn Mackuen, MD      . LORazepam (ATIVAN) tablet 1 mg  1 mg Oral Q8H PRN Courteney Lyn Mackuen, MD      .  ondansetron (ZOFRAN) tablet 4 mg  4 mg Oral Q8H PRN Courteney Lyn Mackuen, MD      . QUEtiapine (SEROQUEL) tablet 25 mg  25 mg Oral QHS Benjamine Mola, FNP   25 mg at 10/13/16 2108  . white petrolatum (VASELINE) gel   Topical PRN Benjamine Mola, FNP   0.2 application at 47/82/95 1413   Current Outpatient Prescriptions  Medication Sig Dispense Refill  . Aspirin-Salicylamide-Caffeine (BC HEADACHE POWDER PO) Take  1 packet by mouth daily as needed (pain).    Marland Kitchen ibuprofen (ADVIL,MOTRIN) 200 MG tablet Take 800-1,200 mg by mouth every 6 (six) hours as needed for moderate pain.    Marland Kitchen amitriptyline (ELAVIL) 25 MG tablet Take 1 tablet (25 mg total) by mouth at bedtime. For pain management, depression, and insomnia. 30 tablet 0  . carbamazepine (TEGRETOL) 200 MG tablet Take 1 tablet (200 mg total) by mouth 2 (two) times daily. For mood control. 60 tablet 0  . cloNIDine (CATAPRES) 0.1 MG tablet For opiate withdrawal symptoms, Take by mouth 4 a day on Aug 1 and 2, then 2 a day for Aug 3 and 4, then stop (Patient not taking: Reported on 10/12/2016) 10 tablet 0  . ibuprofen (ADVIL,MOTRIN) 200 MG tablet Take 800 mg by mouth every 6 (six) hours as needed for moderate pain.    . meloxicam (MOBIC) 15 MG tablet Take 1 tablet (15 mg total) by mouth daily. (Patient not taking: Reported on 10/12/2016) 30 tablet 1  . pantoprazole (PROTONIX) 20 MG tablet Take 1 tablet (20 mg total) by mouth 2 (two) times daily before a meal. For control of stomach acid secretion and helps GERD. 30 tablet 0  . sertraline (ZOLOFT) 100 MG tablet Take 1.5 tablets (150 mg total) by mouth daily. For depression. Pt takes 1.5 tab for 150 mg dose (Patient not taking: Reported on 10/12/2016) 45 tablet 0    Musculoskeletal: Strength & Muscle Tone: within normal limits Gait & Station: normal Patient leans: N/A  Psychiatric Specialty Exam: Physical Exam  Review of Systems  Psychiatric/Behavioral: Positive for depression and substance abuse. Negative for hallucinations and suicidal ideas. The patient is nervous/anxious and has insomnia.   All other systems reviewed and are negative.   Blood pressure 129/62, pulse 65, temperature 97.9 F (36.6 C), temperature source Oral, resp. rate 16, height _0  (1.753 m), weight 72.6 kg (160 lb), SpO2 98 %.Body mass index is 23.63 kg/m.  General Appearance: Casual and Fairly Groomed  Eye Contact:  Good  Speech:  Clear  and Coherent and Normal Rate  Volume:  Normal  Mood:  Irritable and a little frustrated when talking about rehab   Affect:  Appropriate, Congruent and Depressed  Thought Process:  Coherent, Goal Directed, Linear and Descriptions of Associations: Intact  Orientation:  Full (Time, Place, and Person)  Thought Content:  Symptoms, worries, concerns, drug use, family situations, talking about discharge plans and working  Suicidal Thoughts:  No  Homicidal Thoughts:  No  Memory:  Immediate;   Fair Recent;   Fair Remote;   Fair  Judgement:  Fair  Insight:  Fair  Psychomotor Activity:  Normal  Concentration:  Concentration: Good and Attention Span: Good  Recall:  [employer] of Knowledge:  Fair  Language:  Fair  Akathisia:  No  Handed:    AIMS (if indicated):     Assets:  Communication Skills Desire for Improvement Resilience Social Support  ADL's:  Intact  Cognition:  WNL  Sleep:  Treatment Plan Summary: Substance induced mood disorder ([place]) improving, and stable for discharge, managed as below,  Medications: Reviewed on 10/14/16 and will continue as follows:  -Seroquel 32m po qhs   Disposition: Inpatient psychiatric stabilization with plan to go to long-term rehab; pt in agreement to sign in voluntarily   WBenjamine Mola FNorth [place]12/01/2016 1:33 PM  Patient case reviewed in rounds with NP and treatment team Agree with NP note and assessment as above

## 2016-10-14 NOTE — ED Notes (Signed)
Report to include Situation, Background, Assessment, and Recommendations received from Edie Marvin RN. Patient alert and oriented, warm and dry, in no acute distress. Patient denies SI, HI, AVH and pain. Patient made aware of Q15 minute rounds and security cameras for their safety. Patient instructed to come to me with needs or concerns. 

## 2016-10-14 NOTE — ED Notes (Signed)
Pt is calm and cooperative today.  He is somewhat withdrawn.  He denies S/I, H/I, and AVH.  15 minute checks and video monitoring in place.

## 2016-10-15 ENCOUNTER — Observation Stay (HOSPITAL_COMMUNITY)
Admission: AD | Admit: 2016-10-15 | Discharge: 2016-10-16 | Disposition: A | Discharge status: Home / Self Care | Payer: Self-pay | Source: Intra-hospital | Attending: Psychiatry | Admitting: Psychiatry

## 2016-10-15 ENCOUNTER — Encounter (HOSPITAL_COMMUNITY): Payer: Self-pay

## 2016-10-15 DIAGNOSIS — F1510 Other stimulant abuse, uncomplicated: Secondary | ICD-10-CM | POA: Insufficient documentation

## 2016-10-15 DIAGNOSIS — F319 Bipolar disorder, unspecified: Secondary | ICD-10-CM | POA: Insufficient documentation

## 2016-10-15 DIAGNOSIS — F1994 Other psychoactive substance use, unspecified with psychoactive substance-induced mood disorder: Principal | ICD-10-CM | POA: Diagnosis present

## 2016-10-15 DIAGNOSIS — Z7982 Long term (current) use of aspirin: Secondary | ICD-10-CM | POA: Insufficient documentation

## 2016-10-15 DIAGNOSIS — Z79899 Other long term (current) drug therapy: Secondary | ICD-10-CM | POA: Insufficient documentation

## 2016-10-15 DIAGNOSIS — Z9889 Other specified postprocedural states: Secondary | ICD-10-CM | POA: Insufficient documentation

## 2016-10-15 DIAGNOSIS — F17210 Nicotine dependence, cigarettes, uncomplicated: Secondary | ICD-10-CM | POA: Insufficient documentation

## 2016-10-15 MED ORDER — ACETAMINOPHEN 325 MG PO TABS: 650.0000 mg | ORAL_TABLET | ORAL | Status: DC | PRN

## 2016-10-15 MED ORDER — LORAZEPAM 1 MG PO TABS
1.0000 mg | ORAL_TABLET | Freq: Three times a day (TID) | ORAL | Status: DC | PRN
  Administered 2016-10-15: 1 mg via ORAL
  Filled 2016-10-15: qty 1

## 2016-10-15 MED ORDER — WHITE PETROLATUM GEL: Status: DC | PRN

## 2016-10-15 MED ORDER — ONDANSETRON HCL 4 MG PO TABS: 4.0000 mg | ORAL_TABLET | Freq: Three times a day (TID) | ORAL | Status: DC | PRN

## 2016-10-15 MED ORDER — IBUPROFEN 600 MG PO TABS
600.0000 mg | ORAL_TABLET | Freq: Three times a day (TID) | ORAL | Status: DC | PRN
  Administered 2016-10-16: 600 mg via ORAL
  Filled 2016-10-15: qty 1

## 2016-10-15 MED ORDER — NICOTINE 21 MG/24HR TD PT24
21.0000 mg | MEDICATED_PATCH | Freq: Every day | TRANSDERMAL | Status: DC
  Administered 2016-10-15 – 2016-10-16 (×2): 21 mg via TRANSDERMAL
  Filled 2016-10-15 (×2): qty 1

## 2016-10-15 MED ORDER — TRAZODONE HCL 50 MG PO TABS
50.0000 mg | ORAL_TABLET | Freq: Every evening | ORAL | Status: DC | PRN
  Administered 2016-10-15: 50 mg via ORAL
  Filled 2016-10-15: qty 7
  Filled 2016-10-15: qty 1
  Filled 2016-10-15: qty 7

## 2016-10-15 MED ORDER — QUETIAPINE FUMARATE 25 MG PO TABS
25.0000 mg | ORAL_TABLET | Freq: Every day | ORAL | Status: DC
  Administered 2016-10-15: 25 mg via ORAL
  Filled 2016-10-15: qty 1
  Filled 2016-10-15: qty 7

## 2016-10-15 NOTE — ED Notes (Signed)
Introduced self to patient. Pt oriented to unit expectations.  Assessed pt for:  A) Anxiety &/or agitation: Pt is calm and cooperative. He reports anxiety and depression and would like to be admitted inpatient somewhere.   S) Safety: Safety maintained with q-15-minute checks and hourly rounds by staff.  A) ADLs: Pt able to perform ADLs independently.  P) Pick-Up (room cleanliness): Pt's room clean and free of clutter.

## 2016-10-15 NOTE — Progress Notes (Signed)
D: Patient was seen awake watching TV. Patient verbalizes concern on [facility] and if he got discharge tomorrow, where he's going to be till Wednesday. Patient denies pain, SI/HI, AH/VH at this time. No behavioral issues noted.  A: Staff offered support and concerns addressed. Due medications given as ordered. Constant observation for safety maintained except when patient is in the bathroom. Will continue to monitor patient for safety and stability. R: Patient receptive to nursing interventions.

## 2016-10-15 NOTE — ED Notes (Signed)
Patient noted sleeping in room. No complaints, stable, in no acute distress. Q15 minute rounds and monitoring via Security Cameras to continue.  

## 2016-10-15 NOTE — Progress Notes (Signed)
This Clinical research associatewriter called [facility] and spoke with Archie Pattenonya to schedule an appointment for this patient. This Clinical research associatewriter scheduled this [facility] at 8:45 am with his assigned therapist, Byrd HesselbachMaria. He will have to report to intake at 8:00 am. This Clinical research associatewriter was also [facility] 7:30 am when doors will open. This Clinical research associatewriter informed pt about his appointment and made sure it was okay to fax [facility] to [facility]. Pt was happy and confirmed that it was okay. Pt told this Clinical research associatewriter that he will be there Wednesday he just has to start searching for transportation. This Clinical research associatewriter informed pt that he may able to get bus passes if he needs transportation.

## 2016-10-15 NOTE — H&P (Signed)
[place] [facility] H & P  Patient Identification: Caleb Rowe MRN:  [id number] Principal Diagnosis: Substance induced mood disorder [facility]) Diagnosis:        Patient Active Problem List   Diagnosis Date Noted  . 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 Substance induced mood disorder ([place]) [F19.94] 10/13/2016    Priority: High  . Bipolar affective disorder, depressed, moderate degree ([place]) [F31.32] 06/10/2012    Priority: High    Class: Chronic  . Opioid dependence ([place]) [F11.20] 06/10/2012  . Opiate dependence, continuous ([place]) [F11.20] 06/10/2012    Class: Acute    Total Time spent with patient: 25 minutes   Subjective:   Caleb Rowe is a 30 y.o. male patient admitted to [facility] with reports of paranoid ideation after consuming MDMA (molly).  Pt is alert/oriented x4, calm, cooperative, and appropriate to situation. Pt denies suicidal/homicidal ideation and psychosis and does not appear to be responding to internal stimuli. Pt has been considering the benefits of inpatient rehabilitation but would like to discuss this with his father.   HPI:  I have reviewed and concur with HPI elements below, modified as follows:  Caleb T Vaughnis an 30 y.o.male. Patient admitted that he attempted to overdose on molly on admission to the hospital. Pt states that it was an attempt to end his life. Pt unsure of how much he took. Patient says he feels his friends are turning on him. Mother brought him to [facility] out of fear he may harm himself. Patient's TTS consult had been withdrawn initially due to patient's paranoia.   Patient denies he wants to kill himself. He denies HI.Patient denies current A/V hallucinations but says he has them when taking molly.Patient is unclear about how much molly he takes but says it has been every other day for the last 2 months. He says it had been less the month before. Patient endorses use of cocaine and THC but UDS is  clear. Patient says he does not drink.   Patient on 10/15/2016 is denying any further paranoia stating "I think there was some meth in that [place]. I have never been like that before. I thought there was people hiding in the woods. I just need to stay away from [facility]. I started using after a break up. I guess I need to find some better ways to handle my stress." Caleb Rowe expressed worry about not having a place to stay. He was encouraged to use the afternoon of 10/15/2016 to make necessary phone calls to secure a housing plan as he will likely discharge in the morning.   Past Psychiatric History: drug abuse, depression  Risk to Self: Suicidal Ideation: No-Not Currently/Within Last 6 Months Suicidal Intent: No-Not Currently/Within Last 6 Months Is patient at risk for suicide?: Yes Suicidal Plan?: Denies Specify Current Suicidal Plan: Had said he would overdose on molly but now denies  Access to Means: Yes Specify Access to Suicidal Means: Street drugs What has been your use of drugs/alcohol within the last 12 months?: Molly, cocaine, THC How many times?: 0 Other Self Harm Risks: None Triggers for Past Attempts: None known Intentional Self Injurious Behavior: None Risk to Others: Homicidal Ideation: No-Not Currently/Within Last 6 Months Thoughts of Harm to Others: No-Not Currently Present/Within Last 6 Months Current Homicidal Intent: No Current Homicidal Plan: No Access to Homicidal Means: No Identified Victim: No one History of harm to others?: No Assessment of Violence: None Noted Violent Behavior Description: Pt denies Does patient have  access to weapons?: No Criminal Charges Pending?: No Does patient have a court date: No Prior Inpatient Therapy: Prior Inpatient Therapy: Yes Prior Therapy Dates: 2013 Prior Therapy Facilty/Provider(s): [facility] Reason for Treatment: SA Prior Outpatient Therapy: Prior Outpatient Therapy: No Prior Therapy Dates: None Prior Therapy Facilty/Provider(s):  None Reason for Treatment: None Does patient have an ACCT team?: No Does patient have Intensive In-House Services?  : No Does patient have [facility] services? : No Does patient have [facility] services?: No  Past Medical History:      Past Medical History:  Diagnosis Date  . Depression   . Seizures ([place])   . Sociopathic personality disorder          Past Surgical History:  Procedure Laterality Date  . KNEE SURGERY     Family History: No family history on file. Family Psychiatric  History: denies Social History:       History  Alcohol Use No    Comment: rarely           History  Drug Use No    Comment: pt denies (07/03/15) but previously documented     Social History        Social History  . Marital status: Unknown    Spouse name: N/A  . Number of children: N/A  . Years of education: N/A         Social History Main Topics  . Smoking status: Current Every Day Smoker    Packs/day: 0.25    Years: 10.00    Types: Cigarettes  . Smokeless tobacco: Current User  . Alcohol use No     Comment: rarely  . Drug use: No     Comment: pt denies (07/03/15) but previously documented  . Sexual activity: Yes       Other Topics Concern  . None      Social History Narrative  . None   Additional Social History:    Allergies:  No Known Allergies  Labs:  Lab Results Last 48 Hours        Results for orders placed or performed during the hospital encounter of 10/12/16 (from the past 48 hour(s))  Comprehensive metabolic panel     Status: Abnormal   Collection Time: 10/12/16  4:47 PM  Result Value Ref Range   Sodium 139 135 - 145 mmol/L   Potassium 3.6 3.5 - 5.1 mmol/L   Chloride 99 (L) 101 - 111 mmol/L   CO2 28 22 - 32 mmol/L   Glucose, Bld 96 65 - 99 mg/dL   BUN 23 (H) 6 - 20 mg/dL   Creatinine, Ser 1.36 (H) 0.61 - 1.24 mg/dL   Calcium 10.0 8.9 - 10.3 mg/dL   Total Protein 9.0 (H) 6.5 - 8.1 g/dL   Albumin 5.6 (H) 3.5 - 5.0 g/dL    AST 27 15 - 41 U/L   ALT 16 (L) 17 - 63 U/L   Alkaline Phosphatase 74 38 - 126 U/L   Total Bilirubin 0.7 0.3 - 1.2 mg/dL   GFR calc non Af Amer >60 >60 mL/min   GFR calc Af Amer >60 >60 mL/min    Comment: (NOTE) The eGFR has been calculated using the CKD EPI equation. This calculation has not been validated in all clinical situations. eGFR's persistently <60 mL/min signify possible Chronic Kidney Disease.   Anion gap 12 5 - 15  Ethanol     Status: None   Collection Time: 10/12/16  4:47 PM  Result Value Ref Range  Alcohol, Ethyl (B) <5 <5 mg/dL    Comment:        LOWEST DETECTABLE LIMIT FOR SERUM ALCOHOL IS 5 mg/dL FOR MEDICAL PURPOSES ONLY  Salicylate level     Status: None   Collection Time: 10/12/16  4:47 PM  Result Value Ref Range   Salicylate Lvl <4.0 2.8 - 30.0 mg/dL  Acetaminophen level     Status: Abnormal   Collection Time: 10/12/16  4:47 PM  Result Value Ref Range   Acetaminophen (Tylenol), Serum <10 (L) 10 - 30 ug/mL    Comment:        THERAPEUTIC CONCENTRATIONS VARY SIGNIFICANTLY. A RANGE OF 10-30 ug/mL MAY BE AN EFFECTIVE CONCENTRATION FOR MANY PATIENTS. HOWEVER, SOME ARE BEST TREATED AT CONCENTRATIONS OUTSIDE THIS RANGE. ACETAMINOPHEN CONCENTRATIONS >150 ug/mL AT 4 HOURS AFTER INGESTION AND >50 ug/mL AT 12 HOURS AFTER INGESTION ARE OFTEN ASSOCIATED WITH TOXIC REACTIONS.  cbc     Status: Abnormal   Collection Time: 10/12/16  4:47 PM  Result Value Ref Range   WBC 11.5 (H) 4.0 - 10.5 K/uL   RBC 5.24 4.22 - 5.81 MIL/uL   Hemoglobin 16.0 13.0 - 17.0 g/dL   HCT 44.1 39.0 - 52.0 %   MCV 84.2 78.0 - 100.0 fL   MCH 30.5 26.0 - 34.0 pg   MCHC 36.3 (H) 30.0 - 36.0 g/dL   RDW 12.2 11.5 - 15.5 %   Platelets 356 150 - 400 K/uL  Rapid urine drug screen (hospital performed)     Status: None   Collection Time: 10/12/16  4:50 PM  Result Value Ref Range   Opiates NONE DETECTED NONE DETECTED   Cocaine NONE DETECTED NONE DETECTED    Benzodiazepines NONE DETECTED NONE DETECTED   Amphetamines NONE DETECTED NONE DETECTED   Tetrahydrocannabinol NONE DETECTED NONE DETECTED   Barbiturates NONE DETECTED NONE DETECTED    Comment:        DRUG SCREEN FOR MEDICAL PURPOSES ONLY.  IF CONFIRMATION IS NEEDED FOR ANY PURPOSE, NOTIFY LAB WITHIN 5 DAYS.        LOWEST DETECTABLE LIMITS FOR URINE DRUG SCREEN Drug Class       Cutoff (ng/mL) Amphetamine      1000 Barbiturate      200 Benzodiazepine   347 Tricyclics       425 Opiates          300 Cocaine          300 THC              50  CBG monitoring, ED     Status: None   Collection Time: 10/12/16  5:19 PM  Result Value Ref Range   Glucose-Capillary 87 65 - 99 mg/dL               Current Facility-Administered Medications  Medication Dose Route Frequency Provider Last Rate Last Dose  . acetaminophen (TYLENOL) tablet 650 mg  650 mg Oral Q4H PRN Courteney Lyn Mackuen, MD      . ibuprofen (ADVIL,MOTRIN) tablet 600 mg  600 mg Oral Q8H PRN Courteney Lyn Mackuen, MD      . LORazepam (ATIVAN) tablet 1 mg  1 mg Oral Q8H PRN Courteney Lyn Mackuen, MD      . ondansetron (ZOFRAN) tablet 4 mg  4 mg Oral Q8H PRN Courteney Lyn Mackuen, MD      . QUEtiapine (SEROQUEL) tablet 25 mg  25 mg Oral QHS Benjamine Mola, FNP   25 mg at 10/13/16  2108  . white petrolatum (VASELINE) gel   Topical PRN Benjamine Mola, FNP   0.2 application at 57/32/20 1413         Current Outpatient Prescriptions  Medication Sig Dispense Refill  . Aspirin-Salicylamide-Caffeine (BC HEADACHE POWDER PO) Take 1 packet by mouth daily as needed (pain).    Marland Kitchen ibuprofen (ADVIL,MOTRIN) 200 MG tablet Take 800-1,200 mg by mouth every 6 (six) hours as needed for moderate pain.    Marland Kitchen amitriptyline (ELAVIL) 25 MG tablet Take 1 tablet (25 mg total) by mouth at bedtime. For pain management, depression, and insomnia. 30 tablet 0  . carbamazepine (TEGRETOL) 200 MG tablet Take 1 tablet (200 mg total) by mouth 2 (two)  times daily. For mood control. 60 tablet 0  . cloNIDine (CATAPRES) 0.1 MG tablet For opiate withdrawal symptoms, Take by mouth 4 a day on Aug 1 and 2, then 2 a day for Aug 3 and 4, then stop (Patient not taking: Reported on 10/12/2016) 10 tablet 0  . ibuprofen (ADVIL,MOTRIN) 200 MG tablet Take 800 mg by mouth every 6 (six) hours as needed for moderate pain.    . meloxicam (MOBIC) 15 MG tablet Take 1 tablet (15 mg total) by mouth daily. (Patient not taking: Reported on 10/12/2016) 30 tablet 1  . pantoprazole (PROTONIX) 20 MG tablet Take 1 tablet (20 mg total) by mouth 2 (two) times daily before a meal. For control of stomach acid secretion and helps GERD. 30 tablet 0  . sertraline (ZOLOFT) 100 MG tablet Take 1.5 tablets (150 mg total) by mouth daily. For depression. Pt takes 1.5 tab for 150 mg dose (Patient not taking: Reported on 10/12/2016) 45 tablet 0    Musculoskeletal: Strength & Muscle Tone: within normal limits Gait & Station: normal Patient leans: N/A  Psychiatric Specialty Exam: Physical Exam  Review of Systems  Psychiatric/Behavioral: Positive for depression and substance abuse. Negative for hallucinations and suicidal ideas. The patient is nervous/anxious and has insomnia.   All other systems reviewed and are negative.   Blood pressure 129/62, pulse 65, temperature 97.9 F (36.6 C), temperature source Oral, resp. rate 16, height '5\' 9"'$  (1.753 m), weight 72.6 kg (160 lb), SpO2 98 %.Body mass index is 23.63 kg/m.  General Appearance: Casual and Fairly Groomed  Eye Contact:  Good  Speech:  Clear and Coherent and Normal Rate  Volume:  Normal  Mood:  Anxious about where he will stay upon discharge   Affect:  Appropriate  Thought Process:  Coherent, Goal Directed, Linear and Descriptions of Associations: Intact  Orientation:  Full (Time, Place, and Person)  Thought Content:  Symptoms, worries, concerns, drug use, family situations, talking about discharge plans and working   Suicidal Thoughts:  No  Homicidal Thoughts:  No  Memory:  Immediate;   Fair Recent;   Fair Remote;   Fair  Judgement:  Fair  Insight:  Fair  Psychomotor Activity:  Normal  Concentration:  Concentration: Good and Attention Span: Good  Recall:  [employer] of Knowledge:  Fair  Language:  Fair  Akathisia:  No  Handed:    AIMS (if indicated):     Assets:  Communication Skills Desire for Improvement Resilience Social Support  ADL's:  Intact  Cognition:  WNL  Sleep:      Treatment Plan Summary: Substance induced mood disorder ([place]) improving, and stable for discharge 10/16/2016, managed as below,  Medications: Reviewed on 10/15/16 and will continue as follows:  -Seroquel '25mg'$  po qhs for mood  control/recent paranoia   Disposition: Patient to be monitored in the [place] [facility] with probable discharge tomorrow morning with outpatient resources to address his substance use.   Elmarie Shiley PMHNP-C 10/15/2016 3:33 PM

## 2016-10-15 NOTE — Progress Notes (Signed)
This Clinical research associatewriter spoke with pt about getting written consent to call [facility] to schedule an appointment and fax over information. Pt was receptive to all communication and agreed to sign written consent for [facility] to have his information. This Clinical research associatewriter filed the consent form in patient's file.

## 2016-10-15 NOTE — BH Assessment (Signed)
[facility] Assessment Progress Note  Per Thedore MinsMojeed Akintayo, MD, this pt [facility].  Lillia AbedLindsay, RN, [facility] Obs [facility]; they will be ready to receive pt at 11:30.  Pt has signed Voluntary Admission and Consent for Treatment, as well as Consent to Release Information to his parents, and signed forms [facility].  Pt's nurse, Diane, has been notified, and agrees to send original paperwork along with pt via [facility] [place], and to call report to [phone] or [phone].  Doylene Canninghomas Kanisha Duba, MA Triage Specialist [phone]

## 2016-10-15 NOTE — Progress Notes (Addendum)
Admission Note: Admitted patient, a [age] y /o male, [facility]  [facility]  Patient reportedly told ED that he had overdosed on "Molly" on the night prior to ED arrival in an effort to end his life.  Patient reports that his girlfriend just broke up with him and that he is currently homeless.  [facility], he denies suicidal and homicidal ideation and AVH.  He reports that he uses "Kirt BoysMolly" but denies other drug use.  He also denies alcohol use. Patient had [facility] except one pair white earrings which he kept with him (in earlobes).  Skin Assessment done and witnessed by two staff members.  Patient has tattoos on arms and chest but no other skin abnormalities noted; patient oriented to unit and plan of care.  Emotional support provided; encouraged him to seek assistance with needs/concerns.

## 2016-10-15 NOTE — ED Notes (Signed)
[facility] [employer]. Pt did not have any belongings.

## 2016-10-15 NOTE — Progress Notes (Addendum)
[facility] except for one pair white earrings.[facility] that [facility]. [facility] and she reported that they did have patient's belongings and they would send to OBS Unit

## 2016-10-16 DIAGNOSIS — F1994 Other psychoactive substance use, unspecified with psychoactive substance-induced mood disorder: Secondary | ICD-10-CM

## 2016-10-16 MED ORDER — QUETIAPINE FUMARATE 25 MG PO TABS: 25.0000 mg | ORAL_TABLET | Freq: Every day | ORAL | 0 refills | Status: DC

## 2016-10-16 MED ORDER — PANTOPRAZOLE SODIUM 20 MG PO TBEC
20.0000 mg | DELAYED_RELEASE_TABLET | Freq: Every day | ORAL | Status: DC
  Filled 2016-10-16: qty 7

## 2016-10-16 NOTE — Progress Notes (Signed)
D:  Patient oriented x 4; he denies suicidal and homicidal ideation and AVH; no self-injurious behaviors noted or reported. A:  Medications given as scheduled;  Emotional support provided; encouraged him to seek assistance with needs/concerns. R:  Safety maintained on unit.

## 2016-10-16 NOTE — Discharge Summary (Signed)
           [facility]-Observation Unit Discharge Summary      Admit date: 10/15/2016    Discharge date: 10/16/2016   Subjective:  Caleb Rowe a 30 y.o.malepatient [facility] with reports of paranoid ideation after consuming MDMA (molly).  Pt is alert/oriented x4, calm, cooperative, and appropriate to situation. Pt denies suicidal/homicidal ideation and psychosis and does not appear to be responding to internal stimuli. Pt has been considering the benefits of inpatient rehabilitation but would like to discuss this with his father.   HPI: I have reviewed and concur with HPI elements below, modified as follows:  Caleb Rowe an 30 y.o.male. Patient admitted that he attempted to overdose on molly on admission to the hospital. Pt states that it was an attempt to end his life. Pt unsure of how much he took. Patient says he feels his friends are turning on him. [facility]. Patient's TTS consult had been withdrawn initially due to patient's paranoia.   Patient denies he wants to kill himself. He denies HI.Patient denies current A/V hallucinations but says he has them when taking molly.Patient is unclear about how much molly he takes but says it has been every other day for the last 2 months. He says it had been less the month before. Patient endorses use of cocaine and THC but UDS is clear. Patient says he does not drink.   Patient on 10/15/2016 is denying any further paranoia stating "[place] I have never been like that before. I thought there was people hiding in the woods. [facility] I started using after a break up. I guess I need to find some better ways to handle my stress." Caleb Rowe expressed worry about not having a place to stay. He was encouraged to use the afternoon of 10/15/2016 to make necessary phone calls to secure a housing plan as he will likely discharge in  the morning.   Patient was monitored in the [facility]-Observation Unit overnight and re-evaluated on the morning of 10/16/2016. He continued to deny any suicidal ideation and did not express any psychotic symptoms to this Clinical research associatewriter. Caleb Rowe reported that he would be able to stay with his father after discharge and [facility] set for him by staff on 10/17/2016. The patient received a prescription for seroquel along with a sample supply due to being identified as not having any insurance. He left [facility] in stable condition with all belongings returned to him.

## 2016-10-16 NOTE — Progress Notes (Signed)
Written/verbal discharge instructions, prescriptions, sample medications and follow-up appointments given to patient with verbalization of understanding;  Patient denies suicidal and homicidal ideation. Suicide Prevention information/materials given to patient  All patient belongings returned to patient at time of discharge. Discharged home in stable condition. 

## 2016-10-16 NOTE — Discharge Planning (Signed)
[facility] Case Management Discharge Plan :  Will you be returning to the same living situation after discharge:  Patient will be staying with father  At discharge, do you have transportation home?: Yes,  Father will pick him up  Do you have the ability to pay for your medications: No. Sample Medications given  Release of information consent forms completed and in the chart;  Patient's signature needed at discharge.  Patient to Follow up at: [facility] Wednesday, October 17 2016 at 0730   Safety Planning and Suicide Prevention discussed: Yes,  Patient verbalized understanding   Camelia EngKaren H Mihran Lebarron 10/16/2016, 2:58 PM

## 2016-10-16 NOTE — Progress Notes (Signed)
[facility] OBSERVATION UNIT:  Family/Significant Other Suicide Prevention Education  Suicide Prevention Education:  Education Completed; Dimas AlexandriaLarry Ghanem, father,  has been identified by the patient as the family member/significant other with whom the patient will be residing, and identified as the person(s) who will aid the patient in the event of a mental health crisis (suicidal ideations/suicide attempt).  With written consent from the patient, the family member/significant other has been provided the following suicide prevention education, prior to the and/or following the discharge of the patient.  The suicide prevention education provided includes the following:  Suicide risk factors  Suicide prevention and interventions  National Suicide Hotline telephone number  [facility] assessment telephone number  [facility] Emergency Assistance 911  [facility] and/or Residential Mobile Crisis Unit telephone number  Request made of family/significant other to:  Remove weapons (e.g., guns, rifles, knives), all items previously/currently identified as safety concern.    Remove drugs/medications (over-the-counter, prescriptions, illicit drugs), all items previously/currently identified as a safety concern.  The family member/significant other verbalizes understanding of the suicide prevention education information provided.  The family member/significant other agrees to remove the items of safety concern listed above.  Camelia EngKaren H Nikeshia Keetch 10/16/2016, 4:36 PM   Camelia EngKaren H Persephone Schriever, RN 10/16/16  4:36 PM

## 2020-12-06 ENCOUNTER — Encounter: Payer: Self-pay | Admitting: Internal Medicine

## 2020-12-06 ENCOUNTER — Other Ambulatory Visit: Payer: Self-pay

## 2020-12-06 ENCOUNTER — Ambulatory Visit (INDEPENDENT_AMBULATORY_CARE_PROVIDER_SITE_OTHER): Payer: Self-pay | Admitting: Internal Medicine

## 2020-12-06 VITALS — BP 124/82 | HR 69 | Temp 98.2°F | Resp 16 | Ht 71.0 in | Wt 171.0 lb

## 2020-12-06 DIAGNOSIS — Z23 Encounter for immunization: Secondary | ICD-10-CM

## 2020-12-06 DIAGNOSIS — L2084 Intrinsic (allergic) eczema: Secondary | ICD-10-CM | POA: Insufficient documentation

## 2020-12-06 DIAGNOSIS — Z0000 Encounter for general adult medical examination without abnormal findings: Secondary | ICD-10-CM | POA: Insufficient documentation

## 2020-12-06 DIAGNOSIS — R2242 Localized swelling, mass and lump, left lower limb: Secondary | ICD-10-CM

## 2020-12-06 LAB — CBC WITH DIFFERENTIAL/PLATELET
Basophils Absolute: 0 10*3/uL (ref 0.0–0.1)
Basophils Relative: 0.4 % (ref 0.0–3.0)
Eosinophils Absolute: 0.1 10*3/uL (ref 0.0–0.7)
Eosinophils Relative: 1 % (ref 0.0–5.0)
HCT: 45.4 % (ref 39.0–52.0)
Hemoglobin: 15.6 g/dL (ref 13.0–17.0)
Lymphocytes Relative: 28.7 % (ref 12.0–46.0)
Lymphs Abs: 2.3 10*3/uL (ref 0.7–4.0)
MCHC: 34.3 g/dL (ref 30.0–36.0)
MCV: 86.9 fl (ref 78.0–100.0)
Monocytes Absolute: 0.6 10*3/uL (ref 0.1–1.0)
Monocytes Relative: 7 % (ref 3.0–12.0)
Neutro Abs: 5.1 10*3/uL (ref 1.4–7.7)
Neutrophils Relative %: 62.9 % (ref 43.0–77.0)
Platelets: 245 10*3/uL (ref 150.0–400.0)
RBC: 5.22 Mil/uL (ref 4.22–5.81)
RDW: 12.7 % (ref 11.5–15.5)
WBC: 8 10*3/uL (ref 4.0–10.5)

## 2020-12-06 LAB — BASIC METABOLIC PANEL
BUN: 23 mg/dL (ref 6–23)
CO2: 27 mEq/L (ref 19–32)
Calcium: 9.8 mg/dL (ref 8.4–10.5)
Chloride: 101 mEq/L (ref 96–112)
Creatinine, Ser: 1.17 mg/dL (ref 0.40–1.50)
GFR: 80.05 mL/min (ref >60.00)
Glucose, Bld: 96 mg/dL (ref 70–99)
Potassium: 3.7 mEq/L (ref 3.5–5.1)
Sodium: 136 mEq/L (ref 135–145)

## 2020-12-06 LAB — HEPATIC FUNCTION PANEL
ALT: 21 U/L (ref 0–53)
AST: 23 U/L (ref 0–37)
Albumin: 4.8 g/dL (ref 3.5–5.2)
Alkaline Phosphatase: 50 U/L (ref 39–117)
Bilirubin, Direct: 0.1 mg/dL (ref 0.0–0.3)
Total Bilirubin: 0.8 mg/dL (ref 0.2–1.2)
Total Protein: 7.6 g/dL (ref 6.0–8.3)

## 2020-12-06 LAB — LIPID PANEL
Cholesterol: 197 mg/dL (ref 0–200)
HDL: 85.3 mg/dL (ref >39.00)
LDL Cholesterol: 101 mg/dL — ABNORMAL HIGH (ref 0–99)
NonHDL: 111.29
Total CHOL/HDL Ratio: 2
Triglycerides: 53 mg/dL (ref 0.0–149.0)
VLDL: 10.6 mg/dL (ref 0.0–40.0)

## 2020-12-06 MED ORDER — TRIAMCINOLONE ACETONIDE 0.5 % EX CREA: 1.0000 "application " | TOPICAL_CREAM | Freq: Three times a day (TID) | CUTANEOUS | 2 refills | Status: DC

## 2020-12-06 MED ORDER — BOOSTRIX 5-2.5-18.5 LF-MCG/0.5 IM SUSP: 0.5000 mL | Freq: Once | INTRAMUSCULAR | 0 refills | Status: AC

## 2020-12-06 NOTE — Progress Notes (Signed)
Subjective:  Patient ID: Caleb Rowe, male    DOB: 06/01/1986  Age: 35 y.o. MRN: [id number]  CC: Annual Exam and Rash  This visit occurred during the SARS-CoV-2 public health emergency.  Safety protocols were in place, including screening questions prior to the visit, additional usage of staff PPE, and extensive cleaning of exam room while observing appropriate contact time as indicated for disinfecting solutions.   NEW TO ME  HPI Caleb Rowe presents for a CPX.  He complains of a 2 year hx of itchy red bumps over his torso. He has not treated this. He also complains of an enlarging growth on the bottom of his left foot.  History Caleb Rowe has a past medical history of Depression, Seizures (HCC), and Sociopathic personality disorder (HCC).   He has a past surgical history that includes Knee surgery.   His family history is not on file.He reports that he has quit smoking. His smoking use included cigarettes. He has a 2.50 pack-year smoking history. He uses smokeless tobacco. He reports that he does not drink alcohol and does not use drugs.  Outpatient Medications Prior to Visit  Medication Sig Dispense Refill  . ibuprofen (ADVIL,MOTRIN) 200 MG tablet Take 800 mg by mouth every 6 (six) hours as needed for moderate pain.    Marland Kitchen QUEtiapine (SEROQUEL) 25 MG tablet Take 1 tablet (25 mg total) by mouth at bedtime. 14 tablet 0  . pantoprazole (PROTONIX) 20 MG tablet Take 1 tablet (20 mg total) by mouth 2 (two) times daily before a meal. For control of stomach acid secretion and helps GERD. 30 tablet 0   No facility-administered medications prior to visit.    ROS Review of Systems  Constitutional: Negative.  Negative for appetite change, chills, diaphoresis, fatigue and fever.  HENT: Negative.   Eyes: Negative.   Respiratory: Negative for chest tightness, shortness of breath and wheezing.   Cardiovascular: Negative for chest pain, palpitations and leg swelling.  Gastrointestinal:  Negative for abdominal pain, diarrhea, nausea and vomiting.  Endocrine: Negative.   Genitourinary: Negative.  Negative for difficulty urinating, dysuria, hematuria, testicular pain and urgency.  Musculoskeletal: Negative.  Negative for arthralgias and myalgias.  Skin: Positive for rash. Negative for color change.  Neurological: Negative.  Negative for dizziness and weakness.  Hematological: Negative for adenopathy. Does not bruise/bleed easily.  Psychiatric/Behavioral: Negative.     Objective:  BP 124/82   Pulse 69   Temp 98.2 F (36.8 C) (Oral)   Resp 16   Ht 5\' 11"  (1.803 m)   Wt 171 lb (77.6 kg)   SpO2 97%   BMI 23.85 kg/m   Physical Exam Vitals reviewed.  Constitutional:      Appearance: Normal appearance.  HENT:     Nose: Nose normal.     Mouth/Throat:     Mouth: Mucous membranes are moist.  Eyes:     General: No scleral icterus.    Conjunctiva/sclera: Conjunctivae normal.  Cardiovascular:     Rate and Rhythm: Normal rate and regular rhythm.     Pulses: Normal pulses.     Heart sounds: No murmur heard.   Pulmonary:     Effort: Pulmonary effort is normal.     Breath sounds: No stridor. No wheezing, rhonchi or rales.  Abdominal:     General: Abdomen is flat. Bowel sounds are normal. There is no distension.     Palpations: Abdomen is soft. There is no hepatomegaly, splenomegaly or mass.     Tenderness:  There is no abdominal tenderness.     Hernia: No hernia is present.  Musculoskeletal:        General: Normal range of motion.     Cervical back: Neck supple.     Right lower leg: No edema.     Left lower leg: No edema.       Feet:  Lymphadenopathy:     Cervical: No cervical adenopathy.  Skin:    Coloration: Skin is not jaundiced or pale.     Findings: Rash present. No bruising or erythema.     Comments: Scattered erythematous scaly papules over the torso  Neurological:     General: No focal deficit present.     Mental Status: He is alert.  Psychiatric:         Mood and Affect: Mood normal.        Behavior: Behavior normal.     Lab Results  Component Value Date   WBC 8.0 12/06/2020   HGB 15.6 12/06/2020   HCT 45.4 12/06/2020   PLT 245.0 12/06/2020   GLUCOSE 96 12/06/2020   CHOL 197 12/06/2020   TRIG 53.0 12/06/2020   HDL 85.30 12/06/2020   LDLCALC 101 (H) 12/06/2020   ALT 21 12/06/2020   AST 23 12/06/2020   NA 136 12/06/2020   K 3.7 12/06/2020   CL 101 12/06/2020   CREATININE 1.17 12/06/2020   BUN 23 12/06/2020   CO2 27 12/06/2020   TSH 2.82 03/22/2013      Assessment & Plan:   Caleb Rowe was seen today for annual exam and rash.  Diagnoses and all orders for this visit:  Subcutaneous mass of left foot- This looks like a lipoma. -     Ambulatory referral to Podiatry  Routine general medical examination at a health care facility- Exam completed, labs reviewed, vaccines reviewed and updated, patient education was given. 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  We will treat with a topical steroid. -     CBC with Differential/Platelet; Future -     Basic metabolic panel; Future -     RPR; Future -     Hepatic function panel; Future -     triamcinolone cream (KENALOG) 0.5 %; Apply 1 application topically 3 (three) times daily. -     Hepatic function panel -     RPR -     Basic metabolic panel -     CBC with Differential/Platelet  Need for prophylactic vaccination with combined diphtheria-tetanus-pertussis (DTP) vaccine -     Tdap (BOOSTRIX) 5-2.5-18.5 LF-MCG/0.5 injection; Inject 0.5 mLs into the muscle once for 1 dose.  Flu vaccine need -     Flu vaccine, recombinant, trivalent, inj (Flublok, egg free)   I am having Caleb Rowe start on triamcinolone cream and Boostrix. I am also having him maintain his pantoprazole, ibuprofen, and QUEtiapine.  Meds ordered this  encounter  Medications  . triamcinolone cream (KENALOG) 0.5 %    Sig: Apply 1 application topically 3 (three) times daily.    Dispense:  60 g    Refill:  2  . Tdap (BOOSTRIX) 5-2.5-18.5 LF-MCG/0.5 injection    Sig: Inject 0.5 mLs into the muscle once for 1 dose.    Dispense:  0.5 mL  Refill:  0     Follow-up: Return in about 6 months (around 06/05/2021).  Sanda Linger, MD

## 2020-12-06 NOTE — Patient Instructions (Signed)
Lipoma  A lipoma is a noncancerous (benign) tumor that is made up of fat cells. This is a very common type of soft-tissue growth. Lipomas are usually found under the skin (subcutaneous). They may occur in any tissue of the body that contains fat. Common areas for lipomas to appear include the back, arms, shoulders, buttocks, and thighs. Lipomas grow slowly, and they are usually painless. Most lipomas do not cause problems and do not require treatment. What are the causes? The cause of this condition is not known. What increases the risk? You are more likely to develop this condition if:  You are 40-60 years old.  You have a family history of lipomas. What are the signs or symptoms? A lipoma usually appears as a small, round bump under the skin. In most cases, the lump will:  Feel soft or rubbery.  Not cause pain or other symptoms. However, if a lipoma is located in an area where it pushes on nerves, it can become painful or cause other symptoms. How is this diagnosed? A lipoma can usually be diagnosed with a physical exam. You may also have tests to confirm the diagnosis and to rule out other conditions. Tests may include:  Imaging tests, such as a CT scan or an MRI.  Removal of a tissue sample to be looked at under a microscope (biopsy). How is this treated? Treatment for this condition depends on the size of the lipoma and whether it is causing any symptoms.  For small lipomas that are not causing problems, no treatment is needed.  If a lipoma is bigger or it causes problems, surgery may be done to remove the lipoma. Lipomas can also be removed to improve appearance. Most often, the procedure is done after applying a medicine that numbs the area (local anesthetic).  Liposuction may be done to reduce the size of the lipoma before it is removed through surgery, or it may be done to remove the lipoma. Lipomas are removed with this method in order to limit incision size and scarring. A  liposuction tube is inserted through a small incision into the lipoma, and the contents of the lipoma are removed through the tube with suction. Follow these instructions at home:  Watch your lipoma for any changes.  Keep all follow-up visits as told by your health care provider. This is important. Contact a health care provider if:  Your lipoma becomes larger or hard.  Your lipoma becomes painful, red, or increasingly swollen. These could be signs of infection or a more serious condition. Get help right away if:  You develop tingling or numbness in an area near the lipoma. This could indicate that the lipoma is causing nerve damage. Summary  A lipoma is a noncancerous tumor that is made up of fat cells.  Most lipomas do not cause problems and do not require treatment.  If a lipoma is bigger or it causes problems, surgery may be done to remove the lipoma.  Contact a health care provider if your lipoma becomes larger or hard, or if it becomes painful, red, or increasingly swollen. Pain, redness, and swelling could be signs of infection or a more serious condition. This information is not intended to replace advice given to you by your health care provider. Make sure you discuss any questions you have with your health care provider. Document Revised: 06/15/2019 Document Reviewed: 06/15/2019 Elsevier Patient Education  2021 Elsevier Inc.  

## 2020-12-07 LAB — RPR: RPR Ser Ql: NONREACTIVE

## 2020-12-07 LAB — HEPATITIS C ANTIBODY
Hepatitis C Ab: NONREACTIVE
SIGNAL TO CUT-OFF: 0.01 (ref <1.00)

## 2020-12-07 LAB — HIV ANTIBODY (ROUTINE TESTING W REFLEX): HIV 1&2 Ab, 4th Generation: NONREACTIVE

## 2020-12-08 ENCOUNTER — Encounter: Payer: Self-pay | Admitting: Internal Medicine

## 2021-01-09 ENCOUNTER — Ambulatory Visit (INDEPENDENT_AMBULATORY_CARE_PROVIDER_SITE_OTHER): Payer: Self-pay

## 2021-01-09 ENCOUNTER — Encounter: Payer: Self-pay | Admitting: Podiatry

## 2021-01-09 ENCOUNTER — Ambulatory Visit (INDEPENDENT_AMBULATORY_CARE_PROVIDER_SITE_OTHER): Payer: Self-pay | Admitting: Podiatry

## 2021-01-09 ENCOUNTER — Other Ambulatory Visit: Payer: Self-pay

## 2021-01-09 DIAGNOSIS — M722 Plantar fascial fibromatosis: Secondary | ICD-10-CM

## 2021-01-09 DIAGNOSIS — M79672 Pain in left foot: Secondary | ICD-10-CM

## 2021-01-09 MED ORDER — TRIAMCINOLONE ACETONIDE 10 MG/ML IJ SUSP
10.0000 mg | Freq: Once | INTRAMUSCULAR | Status: AC
  Administered 2021-01-09: 10 mg

## 2021-01-09 NOTE — Progress Notes (Signed)
Subjective:   Patient ID: Caleb Rowe, male   DOB: 35 y.o.   MRN: [id number]   HPI Patient presents stating he has a knot on the bottom of his left foot enlargement and his mom had the same thing and that also there is a lesion that is formed within it with no indication or remembrance of foreign body   Review of Systems  All other systems reviewed and are negative.       Objective:  Physical Exam Vitals and nursing note reviewed.  Constitutional:      Appearance: He is well-developed and well-nourished.  Cardiovascular:     Pulses: Intact distal pulses.  Pulmonary:     Effort: Pulmonary effort is normal.  Musculoskeletal:        General: Normal range of motion.  Skin:    General: Skin is warm.  Neurological:     Mental Status: He is alert.     Neurovascular status intact muscle strength adequate range of motion adequate large nodule plantar aspect left arch is been present for a long time and it is fibrous appears to be within the plantar fascia with family history of this with an inflammatory lesion on the middle side of this that is very painful and actually where the but Jorde of the pain is occurring     Assessment:  Inflammatory fasciitis left with plantar fibroma     Plan:  H&P x-ray reviewed sterile prep injected the plantar fascial area 3 mg Dexasone Kenalog 5 g Xylocaine advised on anti-inflammatories topicals and I debrided the lesion.  I discussed removing this is possible but it would be a very extensive surgery due to size of approximate 3.5 x 3.5 cm and if we could avoid it it would be better  X-rays indicate there is no signs of fracture associated with this or may be a small little area on the more proximal portion foreign body but it does not correlate to where the pain is

## 2021-10-17 ENCOUNTER — Ambulatory Visit: Payer: Self-pay | Admitting: Internal Medicine

## 2021-11-27 ENCOUNTER — Other Ambulatory Visit: Payer: Self-pay

## 2021-11-27 ENCOUNTER — Encounter: Payer: Self-pay | Admitting: Internal Medicine

## 2021-11-27 ENCOUNTER — Ambulatory Visit (INDEPENDENT_AMBULATORY_CARE_PROVIDER_SITE_OTHER): Payer: Self-pay | Admitting: Internal Medicine

## 2021-11-27 VITALS — BP 132/84 | HR 80 | Temp 97.8°F | Resp 16 | Ht 71.0 in | Wt 171.0 lb

## 2021-11-27 DIAGNOSIS — N50812 Left testicular pain: Secondary | ICD-10-CM | POA: Insufficient documentation

## 2021-11-27 DIAGNOSIS — Z0000 Encounter for general adult medical examination without abnormal findings: Secondary | ICD-10-CM

## 2021-11-27 DIAGNOSIS — N50811 Right testicular pain: Secondary | ICD-10-CM | POA: Insufficient documentation

## 2021-11-27 LAB — URINALYSIS, ROUTINE W REFLEX MICROSCOPIC
Bilirubin Urine: NEGATIVE
Hgb urine dipstick: NEGATIVE
Ketones, ur: NEGATIVE
Leukocytes,Ua: NEGATIVE
Nitrite: NEGATIVE
RBC / HPF: NONE SEEN (ref 0–?)
Specific Gravity, Urine: 1.015 (ref 1.000–1.030)
Total Protein, Urine: NEGATIVE
Urine Glucose: NEGATIVE
Urobilinogen, UA: 0.2 (ref 0.0–1.0)
WBC, UA: NONE SEEN (ref 0–?)
pH: 6.5 (ref 5.0–8.0)

## 2021-11-27 NOTE — Progress Notes (Addendum)
Subjective:  Patient ID: BRONDON BRAZ, male    DOB: 14-May-1986  Age: 36 y.o. MRN: [id number]  CC: Follow-up  This visit occurred during the SARS-CoV-2 public health emergency.  Safety protocols were in place, including screening questions prior to the visit, additional usage of staff PPE, and extensive cleaning of exam room while observing appropriate contact time as indicated for disinfecting solutions.    HPI FAYETTE FIESER presents for a CPX and f/up -  He complains of a 3 month hx of vague discomfort (aching) in the scrotum on both sides.   Outpatient Medications Prior to Visit  Medication Sig Dispense Refill   ibuprofen (ADVIL,MOTRIN) 200 MG tablet Take 800 mg by mouth every 6 (six) hours as needed for moderate pain.     No facility-administered medications prior to visit.    ROS Review of Systems  Constitutional:  Negative for chills, fatigue and fever.  HENT: Negative.    Eyes: Negative.   Respiratory:  Negative for cough.   Cardiovascular: Negative.  Negative for chest pain.  Gastrointestinal:  Negative for abdominal pain, diarrhea and nausea.  Endocrine: Negative.   Genitourinary:  Positive for testicular pain. Negative for decreased urine volume, difficulty urinating, dysuria, flank pain, frequency, genital sores, hematuria, penile discharge, penile pain, penile swelling, scrotal swelling and urgency.  Musculoskeletal: Negative.  Negative for back pain.  Skin: Negative.  Negative for rash.  Neurological: Negative.  Negative for dizziness, weakness and numbness.  Hematological:  Negative for adenopathy. Does not bruise/bleed easily.  Psychiatric/Behavioral: Negative.     Objective:  BP 132/84 (BP Location: Left Arm, Patient Position: Sitting, Cuff Size: Large)    Pulse 80    Temp 97.8 F (36.6 C) (Oral)    Resp 16    Ht 5\' 11"  (1.803 m)    Wt 171 lb (77.6 kg)    SpO2 98%    BMI 23.85 kg/m   BP Readings from Last 3 Encounters:  11/27/21 132/84  12/06/20  124/82  10/15/16 117/62    Wt Readings from Last 3 Encounters:  11/27/21 171 lb (77.6 kg)  12/06/20 171 lb (77.6 kg)  10/12/16 160 lb (72.6 kg)    Physical Exam Vitals reviewed. Exam conducted with a chaperone present.  HENT:     Nose: Nose normal.     Mouth/Throat:     Mouth: Mucous membranes are moist.  Eyes:     General: No scleral icterus.    Conjunctiva/sclera: Conjunctivae normal.  Cardiovascular:     Rate and Rhythm: Normal rate and regular rhythm.     Pulses: Normal pulses.  Pulmonary:     Effort: Pulmonary effort is normal.     Breath sounds: No stridor. No wheezing, rhonchi or rales.  Abdominal:     General: Abdomen is flat. There is no distension.     Palpations: There is no mass.     Tenderness: There is no abdominal tenderness. There is no guarding or rebound.     Hernia: A hernia is present. Hernia is present in the left inguinal area. There is no hernia in the right inguinal area.  Genitourinary:    Pubic Area: No rash.      Penis: Normal and circumcised.      Testes: Normal.        Right: Mass, tenderness, swelling, testicular hydrocele or varicocele not present. Right testis is descended.        Left: Mass, tenderness, swelling, testicular hydrocele or varicocele not present. Left  testis is descended.     Epididymis:     Right: Normal. Not inflamed or enlarged. No mass.     Left: Normal. Not inflamed or enlarged. No mass.     Prostate: Normal. Not enlarged, not tender and no nodules present.     Rectum: Normal. Guaiac result negative. No mass, tenderness, anal fissure, external hemorrhoid or internal hemorrhoid. Normal anal tone.     Comments: LIH that is felt only with valsalva Musculoskeletal:        General: Normal range of motion.     Cervical back: Neck supple.     Right lower leg: No edema.     Left lower leg: No edema.  Lymphadenopathy:     Cervical: No cervical adenopathy.     Lower Body: No right inguinal adenopathy. No left inguinal  adenopathy.  Skin:    General: Skin is warm and dry.     Findings: No rash.  Neurological:     General: No focal deficit present.     Mental Status: He is alert.  Psychiatric:        Mood and Affect: Mood normal.        Behavior: Behavior normal.    Lab Results  Component Value Date   WBC 8.0 12/06/2020   HGB 15.6 12/06/2020   HCT 45.4 12/06/2020   PLT 245.0 12/06/2020   GLUCOSE 96 12/06/2020   CHOL 197 12/06/2020   TRIG 53.0 12/06/2020   HDL 85.30 12/06/2020   LDLCALC 101 (H) 12/06/2020   ALT 21 12/06/2020   AST 23 12/06/2020   NA 136 12/06/2020   K 3.7 12/06/2020   CL 101 12/06/2020   CREATININE 1.17 12/06/2020   BUN 23 12/06/2020   CO2 27 12/06/2020   TSH 2.82 03/22/2013    No results found.  Assessment & Plan:   Silvia was seen today for follow-up.  Diagnoses and all orders for this visit:  Routine general medical examination at a health care facility  Pain in both testicles- He has a paucity of symptoms.  The examination is normal with the exception of the hernia - he does not want to pursue surgical treatment options for the hernia.  Urinalysis is normal.  I recommended that he undergo an ultrasound to screen for occult mass/malignancy. -     Urinalysis, Routine w reflex microscopic; Future -     Urinalysis, Routine w reflex microscopic -     US SCROTUM; Future   I am having Susa Day maintain his ibuprofen.  No orders of the defined types were placed in this encounter.    Follow-up: Return in about 3 months (around 02/25/2022).  Scarlette Calico, MD

## 2021-11-27 NOTE — Patient Instructions (Signed)
Inguinal Hernia, Adult ?An inguinal hernia develops when fat or the intestines push through a weak spot in a muscle where the leg meets the lower abdomen (groin). This creates a bulge. This kind of hernia could also be: ?In the scrotum, if you are male. ?In folds of skin around the vagina, if you are male. ?There are three types of inguinal hernias: ?Hernias that can be pushed back into the abdomen (are reducible). This type rarely causes pain. ?Hernias that are not reducible (are incarcerated). ?Hernias that are not reducible and lose their blood supply (are strangulated). This type of hernia requires emergency surgery. ?What are the causes? ?This condition is caused by having a weak spot in the muscles or tissues in your groin. This develops over time. The hernia may poke through the weak spot when you suddenly strain your lower abdominal muscles, such as when you: ?Lift a heavy object. ?Strain to have a bowel movement. Constipation can lead to straining. ?Cough. ?What increases the risk? ?This condition is more likely to develop in: ?Males. ?Pregnant females. ?People who: ?Are overweight. ?Work in jobs that require long periods of standing or heavy lifting. ?Have had an inguinal hernia before. ?Smoke or have lung disease. These factors can lead to long-term (chronic) coughing. ?What are the signs or symptoms? ?Symptoms may depend on the size of the hernia. Often, a small inguinal hernia has no symptoms. Symptoms of a larger hernia may include: ?A bulge in the groin area. This is easier to see when standing. It might not be visible when lying down. ?Pain or burning in the groin. This may get worse when lifting, straining, or coughing. ?A dull ache or a feeling of pressure in the groin. ?An unusual bulge in the scrotum, in males. ?Symptoms of a strangulated inguinal hernia may include: ?A bulge in your groin that is very painful and tender to the touch. ?A bulge that turns red or purple. ?Fever, nausea, and  vomiting. ?Inability to have a bowel movement or to pass gas. ?How is this diagnosed? ?This condition is diagnosed based on your symptoms, your medical history, and a physical exam. Your health care provider may feel your groin area and ask you to cough. ?How is this treated? ?Treatment depends on the size of your hernia and whether you have symptoms. If you do not have symptoms, your health care provider may have you watch your hernia carefully and have you come in for follow-up visits. If your hernia is large or if you have symptoms, you may need surgery to repair the hernia. ?Follow these instructions at home: ?Lifestyle ?Avoid lifting heavy objects. ?Avoid standing for long periods of time. ?Do not use any products that contain nicotine or tobacco. These products include cigarettes, chewing tobacco, and vaping devices, such as e-cigarettes. If you need help quitting, ask your health care provider. ?Maintain a healthy weight. ?Preventing constipation ?You may need to take these actions to prevent or treat constipation: ?Drink enough fluid to keep your urine pale yellow. ?Take over-the-counter or prescription medicines. ?Eat foods that are high in fiber, such as beans, whole grains, and fresh fruits and vegetables. ?Limit foods that are high in fat and processed sugars, such as fried or sweet foods. ?General instructions ?You may try to push the hernia back in place by very gently pressing on it while lying down. Do not try to force the bulge back in if it will not push in easily. ?Watch your hernia for any changes in shape, size, or   color. Get help right away if you notice any changes. ?Take over-the-counter and prescription medicines only as told by your health care provider. ?Keep all follow-up visits. This is important. ?Contact a health care provider if: ?You have a fever or chills. ?You develop new symptoms. ?Your symptoms get worse. ?Get help right away if: ?You have pain in your groin that suddenly gets  worse. ?You have a bulge in your groin that: ?Suddenly gets bigger and does not get smaller. ?Becomes red or purple or painful to the touch. ?You are a man and you have a sudden pain in your scrotum, or the size of your scrotum suddenly changes. ?You cannot push the hernia back in place by very gently pressing on it when you are lying down. ?You have nausea or vomiting that does not go away. ?You have a fast heartbeat. ?You cannot have a bowel movement or pass gas. ?These symptoms may represent a serious problem that is an emergency. Do not wait to see if the symptoms will go away. Get medical help right away. Call your local emergency services (911 in the [place] ?Summary ?An inguinal hernia develops when fat or the intestines push through a weak spot in a muscle where your leg meets your lower abdomen (groin). ?This condition is caused by having a weak spot in muscles or tissues in your groin. ?Symptoms may depend on the size of the hernia, and they may include pain or swelling in your groin. A small inguinal hernia often has no symptoms. ?Treatment may not be needed if you do not have symptoms. If you have symptoms or a large hernia, you may need surgery to repair the hernia. ?Avoid lifting heavy objects. Also, avoid standing for long periods of time. ?This information is not intended to replace advice given to you by your health care provider. Make sure you discuss any questions you have with your health care provider. ?Document Revised: 06/28/2020 Document Reviewed: 06/28/2020 ?Elsevier Patient Education ? 2022 [employer] ? ?

## 2021-11-29 ENCOUNTER — Telehealth: Payer: Self-pay

## 2021-11-29 NOTE — Telephone Encounter (Signed)
Pt has been informed via VM that [place] was ordered.

## 2021-11-29 NOTE — Telephone Encounter (Signed)
Thanks!  I called patient back to let them know that ultrasound have been ordered.   Advised pt that they could call [phone] to find out the out of pocket cost.

## 2021-11-29 NOTE — Telephone Encounter (Signed)
Pt would like to move forward with ordering an ultrasound.  CB [phone]

## 2021-12-19 ENCOUNTER — Other Ambulatory Visit: Payer: Self-pay

## 2022-01-30 ENCOUNTER — Ambulatory Visit
Admission: RE | Admit: 2022-01-30 | Discharge: 2022-01-30 | Disposition: A | Discharge status: Home / Self Care | Payer: No Typology Code available for payment source | Source: Ambulatory Visit | Attending: Internal Medicine | Admitting: Internal Medicine

## 2022-01-30 DIAGNOSIS — N50811 Right testicular pain: Secondary | ICD-10-CM

## 2022-05-01 ENCOUNTER — Ambulatory Visit: Payer: Self-pay | Admitting: Internal Medicine

## 2022-05-09 ENCOUNTER — Encounter: Payer: Self-pay | Admitting: Internal Medicine

## 2022-05-09 ENCOUNTER — Ambulatory Visit (INDEPENDENT_AMBULATORY_CARE_PROVIDER_SITE_OTHER): Payer: Self-pay | Admitting: Internal Medicine

## 2022-05-09 VITALS — BP 124/76 | HR 80 | Temp 97.8°F | Ht 71.0 in | Wt 162.0 lb

## 2022-05-09 DIAGNOSIS — F320 Major depressive disorder, single episode, mild: Secondary | ICD-10-CM

## 2022-05-09 MED ORDER — LAMOTRIGINE 25 MG PO TABS: 25.0000 mg | ORAL_TABLET | Freq: Every day | ORAL | 0 refills | Status: DC

## 2022-05-09 MED ORDER — SERTRALINE HCL 50 MG PO TABS: 50.0000 mg | ORAL_TABLET | Freq: Every day | ORAL | 0 refills | Status: DC

## 2022-05-09 NOTE — Progress Notes (Signed)
Subjective:  Patient ID: Caleb Rowe, male    DOB: 07-24-1986  Age: 36 y.o. MRN: [id number]  CC: Depression   HPI Caleb Rowe presents for f/up -  He was previously treated for depression with sertraline and Lamictal.  He felt well for a long period of time so he stopped taking them.  He complains of a several month history of irritability, anxiety, anhedonia, and insomnia.  He denies SI or HI.  His appetite is good but he has lost 9 pounds..  Outpatient Medications Prior to Visit  Medication Sig Dispense Refill   ibuprofen (ADVIL,MOTRIN) 200 MG tablet Take 800 mg by mouth every 6 (six) hours as needed for moderate pain.     No facility-administered medications prior to visit.    ROS Review of Systems  Constitutional:  Positive for unexpected weight change. Negative for diaphoresis and fatigue.  HENT: Negative.    Eyes: Negative.   Respiratory:  Negative for cough, chest tightness, shortness of breath and wheezing.   Cardiovascular:  Negative for chest pain, palpitations and leg swelling.  Gastrointestinal:  Negative for abdominal pain.  Endocrine: Negative.   Genitourinary: Negative.  Negative for difficulty urinating.  Musculoskeletal: Negative.   Skin: Negative.   Neurological: Negative.   Psychiatric/Behavioral:  Positive for dysphoric mood and sleep disturbance. Negative for confusion, decreased concentration, hallucinations, self-injury and suicidal ideas. The patient is nervous/anxious. The patient is not hyperactive.     Objective:  BP 124/76 (BP Location: Right Arm, Patient Position: Sitting, Cuff Size: Large)   Pulse 80   Temp 97.8 F (36.6 C) (Oral)   Ht 5\' 11"  (1.803 m)   Wt 162 lb (73.5 kg)   SpO2 96%   BMI 22.59 kg/m   BP Readings from Last 3 Encounters:  05/09/22 124/76  11/27/21 132/84  12/06/20 124/82    Wt Readings from Last 3 Encounters:  05/09/22 162 lb (73.5 kg)  11/27/21 171 lb (77.6 kg)  12/06/20 171 lb (77.6 kg)    Physical  Exam Vitals reviewed.  Constitutional:      Appearance: Normal appearance.  Neurological:     Mental Status: He is alert.  Psychiatric:        Attention and Perception: Attention and perception normal.        Mood and Affect: Mood is depressed. Mood is not anxious or elated. Affect is flat. Affect is not labile, blunt, angry, tearful or inappropriate.        Speech: Speech normal. Speech is not delayed or tangential.        Behavior: Behavior normal. Behavior is cooperative.        Thought Content: Thought content normal. Thought content is not paranoid or delusional. Thought content does not include homicidal or suicidal ideation.        Cognition and Memory: Cognition normal.        Judgment: Judgment normal.     Lab Results  Component Value Date   WBC 8.0 12/06/2020   HGB 15.6 12/06/2020   HCT 45.4 12/06/2020   PLT 245.0 12/06/2020   GLUCOSE 96 12/06/2020   CHOL 197 12/06/2020   TRIG 53.0 12/06/2020   HDL 85.30 12/06/2020   LDLCALC 101 (H) 12/06/2020   ALT 21 12/06/2020   AST 23 12/06/2020   NA 136 12/06/2020   K 3.7 12/06/2020   CL 101 12/06/2020   CREATININE 1.17 12/06/2020   BUN 23 12/06/2020   CO2 27 12/06/2020   TSH 2.82 03/22/2013  US SCROTUM W/DOPPLER  Result Date: 02/02/2022 CLINICAL DATA:  Bilateral scrotal pain. Remote history left varicocele. Scrotal pain is left-greater-than-right. Denies palpable mass. EXAM: SCROTAL ULTRASOUND DOPPLER ULTRASOUND OF THE TESTICLES TECHNIQUE: Complete ultrasound examination of the testicles, epididymis, and other scrotal structures was performed. Color and spectral Doppler ultrasound were also utilized to evaluate blood flow to the testicles. COMPARISON:  None. FINDINGS: Right testicle Measurements: 5.6 x 2.5 x 3.1 cm. No mass or microlithiasis visualized. There is a 1.1 mm calcification in the inferior pole. Left testicle Measurements: 4.5 x 2.4 x 3.0 cm. No mass or microlithiasis visualized. Right epididymis: Normal in size  and appearance apart from a single 6 mm simple cyst in the epididymal head. Left epididymis:  Normal in size and appearance. Hydrocele:  None visualized. 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 Left scrotal veins measure up 3.8 mm with Valsalva. Pulsed Doppler interrogation of both testes demonstrates normal low resistance arterial and venous waveforms bilaterally. IMPRESSION: 1. No evidence of testicular mass or torsion. 2. 1 mm calcification lower pole right testicle without microlithiasis. 3. Left-greater-than-right bilateral varicoceles.  No hydroceles. 4. 6 mm left epididymal head simple cyst. Electronically Signed   By: Almira Bar M.D.   On: 02/02/2022 21:18    Assessment & Plan:   Wisdom was seen today for depression.  Diagnoses and all orders for this visit:  Current mild episode of major depressive disorder without prior episode (HCC)- Will start sertraline and lamotrigine.  Will slowly increase the dose over the next few months. -     sertraline (ZOLOFT) 50 MG tablet; Take 1 tablet (50 mg total) by mouth daily. -     lamoTRIgine (LAMICTAL) 25 MG tablet; Take 1 tablet (25 mg total) by mouth daily.   I have discontinued Earna Coder T. Bielinski's ibuprofen. I am also having him start on sertraline and lamoTRIgine.  Meds ordered this encounter  Medications   sertraline (ZOLOFT) 50 MG tablet    Sig: Take 1 tablet (50 mg total) by mouth daily.    Dispense:  30 tablet    Refill:  0   lamoTRIgine (LAMICTAL) 25 MG tablet    Sig: Take 1 tablet (25 mg total) by mouth daily.    Dispense:  30 tablet    Refill:  0     Follow-up: Return in about 6 months (around 11/08/2022).  Sanda Linger, MD

## 2022-05-09 NOTE — Patient Instructions (Signed)
Major Depressive Disorder, Adult Major depressive disorder (MDD) is a mental health condition. It may also be called clinical depression or unipolar depression. MDD causes symptoms of sadness, hopelessness, and loss of interest in things. These symptoms last most of the day, almost every day, for 2 weeks. MDD can also cause physical symptoms. It can interfere with relationships and with everyday activities, such as work, school, and activities that are usually pleasant. MDD may be mild, moderate, or severe. It may be single-episode MDD, which happens once, or recurrent MDD, which may occur multiple times. What are the causes? The exact cause of this condition is not known. MDD is most likely caused by a combination of things, which may include: Your personality traits. Learned or conditioned behaviors or thoughts or feelings that reinforce negativity. Any alcohol or substance misuse. Long-term (chronic) physical or mental health illness. Going through a traumatic experience or major life changes. What increases the risk? The following factors may make someone more likely to develop MDD: A family history of depression. Being a woman. Troubled family relationships. Abnormally low levels of certain brain chemicals. Traumatic or painful events in childhood, especially abuse or loss of a parent. A lot of stress from life experiences, such as poor living conditions or discrimination. Chronic physical illness or other mental health disorders. What are the signs or symptoms? The main symptoms of MDD usually include: Constant depressed or irritable mood. A loss of interest in things and activities. Other symptoms include: Sleeping or eating too much or too little. Unexplained weight gain or weight loss. Tiredness or low energy. Being agitated, restless, or weak. Feeling hopeless, worthless, or guilty. Trouble thinking clearly or making decisions. Thoughts of suicide or thoughts of harming  others. Isolating oneself or avoiding other people or activities. Trouble completing tasks, work, or any normal obligations. Severe symptoms of this condition may include: Psychotic depression.This may include false beliefs, or delusions. It may also include seeing, hearing, tasting, smelling, or feeling things that are not real (hallucinations). Chronic depression or persistent depressive disorder. This is low-level depression that lasts for at least 2 years. Melancholic depression, or feeling extremely sad and hopeless. Catatonic depression, which includes trouble speaking and trouble moving. How is this diagnosed? This condition may be diagnosed based on: Your symptoms. Your medical and mental health history. You may be asked questions about your lifestyle, including any drug and alcohol use. A physical exam. Blood tests to rule out other conditions. MDD is confirmed if you have the following symptoms most of the day, nearly every day, in a 2-week period: Either a depressed mood or loss of interest. At least four other MDD symptoms. How is this treated? This condition is usually treated by mental health professionals, such as psychologists, psychiatrists, and clinical social workers. You may need more than one type of treatment. Treatment may include: Psychotherapy, also called talk therapy or counseling. Types of psychotherapy include: Cognitive behavioral therapy (CBT). This teaches you to recognize unhealthy feelings, thoughts, and behaviors, and replace them with positive thoughts and actions. Interpersonal therapy (IPT). This helps you to improve the way you communicate with others or relate to them. Family therapy. This treatment includes members of your family. Medicines to treat anxiety and depression. These medicines help to balance the brain chemicals that affect your emotions. Lifestyle changes. You may be asked to: Limit alcohol use and avoid drug use. Get regular  exercise. Get plenty of sleep. Make healthy eating choices. Spend more time outdoors. Brain stimulation. This may   be done if symptoms are very severe and other treatments have not worked. Examples of this treatment are electroconvulsive therapy and transcranial magnetic stimulation. Follow these instructions at home: Activity Exercise regularly and spend time outdoors. Find activities that you enjoy doing, and make time to do them. Find healthy ways to manage stress, such as: Meditation or deep breathing. Spending time in nature. Journaling. Return to your normal activities as told by your health care provider. Ask your health care provider what activities are safe for you. Alcohol and drug use If you drink alcohol: Limit how much you use to: 0-1 drink a day for women who are not pregnant. 0-2 drinks a day for men. Be aware of how much alcohol is in your drink. In the [place] one drink equals one 12 oz bottle of beer (355 mL), one 5 oz glass of wine (148 mL), or one 1 oz glass of hard liquor (44 mL). Discuss your alcohol use with your health care provider. Alcohol can affect any antidepressant medicines you are taking. Discuss any drug use with your health care provider. General instructions  Take over-the-counter and prescription medicines only as told by your health care provider. Eat a healthy diet and get plenty of sleep. Consider joining a support group. Your health care provider may be able to recommend one. Keep all follow-up visits as told by your health care provider. This is important. Where to find more information National Alliance on Mental Illness: www.nami.org [place] National Institute of Mental Health: www.nimh.nih.gov Contact a health care provider if: Your symptoms get worse. You develop new symptoms. Get help right away if: You self-harm. You have serious thoughts about hurting yourself or others. You hallucinate. If you ever feel like you may hurt yourself or  others, or have thoughts about taking your own life, get help right away. Go to your nearest emergency department or: Call your local emergency services (911 in the [place] Call a suicide crisis helpline, such as the National Suicide Prevention Lifeline at 1-800-273-8255 or 988 in the [place] This is open 24 hours a day in the [place] Text the Crisis Text Line at 741741 (in the [place] Summary Major depressive disorder (MDD) is a mental health condition. MDD causes symptoms of sadness, hopelessness, and loss of interest in things. These symptoms last most of the day, almost every day, for 2 weeks. The symptoms of MDD can interfere with relationships and with everyday activities. Treatments and support are available for people who develop MDD. You may need more than one type of treatment. Get help right away if you have serious thoughts about hurting yourself or others. This information is not intended to replace advice given to you by your health care provider. Make sure you discuss any questions you have with your health care provider. Document Revised: 05/24/2021 Document Reviewed: 10/10/2019 Elsevier Patient Education  2023 [employer]  

## 2022-06-07 ENCOUNTER — Other Ambulatory Visit: Payer: Self-pay | Admitting: Internal Medicine

## 2022-06-07 DIAGNOSIS — F320 Major depressive disorder, single episode, mild: Secondary | ICD-10-CM

## 2022-06-07 MED ORDER — SERTRALINE HCL 100 MG PO TABS: 100.0000 mg | ORAL_TABLET | Freq: Every day | ORAL | 0 refills | Status: DC

## 2022-06-07 MED ORDER — LAMOTRIGINE 25 MG PO TABS: 25.0000 mg | ORAL_TABLET | Freq: Two times a day (BID) | ORAL | 0 refills | Status: DC

## 2022-09-24 ENCOUNTER — Other Ambulatory Visit: Payer: Self-pay | Admitting: Internal Medicine

## 2022-09-24 DIAGNOSIS — F320 Major depressive disorder, single episode, mild: Secondary | ICD-10-CM

## 2022-11-03 ENCOUNTER — Other Ambulatory Visit: Payer: Self-pay | Admitting: Internal Medicine

## 2022-11-03 DIAGNOSIS — F320 Major depressive disorder, single episode, mild: Secondary | ICD-10-CM

## 2023-03-28 ENCOUNTER — Other Ambulatory Visit: Payer: Self-pay | Admitting: Internal Medicine

## 2023-03-28 DIAGNOSIS — F320 Major depressive disorder, single episode, mild: Secondary | ICD-10-CM

## 2023-05-02 ENCOUNTER — Other Ambulatory Visit: Payer: Self-pay | Admitting: Internal Medicine

## 2023-05-02 DIAGNOSIS — F320 Major depressive disorder, single episode, mild: Secondary | ICD-10-CM

## 2023-05-23 ENCOUNTER — Encounter: Payer: Self-pay | Admitting: Internal Medicine

## 2023-06-05 ENCOUNTER — Ambulatory Visit: Payer: Self-pay | Admitting: Internal Medicine

## 2023-07-22 ENCOUNTER — Other Ambulatory Visit: Payer: Self-pay | Admitting: Internal Medicine

## 2023-07-22 DIAGNOSIS — F320 Major depressive disorder, single episode, mild: Secondary | ICD-10-CM

## 2023-07-22 MED ORDER — SERTRALINE HCL 100 MG PO TABS: 100.0000 mg | ORAL_TABLET | Freq: Every day | ORAL | 0 refills | Status: DC

## 2023-07-22 MED ORDER — LAMOTRIGINE 25 MG PO TABS: 25.0000 mg | ORAL_TABLET | Freq: Two times a day (BID) | ORAL | 0 refills | Status: DC

## 2023-08-28 ENCOUNTER — Ambulatory Visit (INDEPENDENT_AMBULATORY_CARE_PROVIDER_SITE_OTHER): Payer: Self-pay | Admitting: Internal Medicine

## 2023-08-28 VITALS — BP 126/76 | HR 65 | Temp 97.9°F | Ht 71.0 in | Wt 169.0 lb

## 2023-08-28 DIAGNOSIS — F320 Major depressive disorder, single episode, mild: Secondary | ICD-10-CM

## 2023-08-28 DIAGNOSIS — R6882 Decreased libido: Secondary | ICD-10-CM

## 2023-08-28 LAB — CBC WITH DIFFERENTIAL/PLATELET
Basophils Absolute: 0 10*3/uL (ref 0.0–0.1)
Basophils Relative: 0.2 % (ref 0.0–3.0)
Eosinophils Absolute: 0 10*3/uL (ref 0.0–0.7)
Eosinophils Relative: 0.4 % (ref 0.0–5.0)
HCT: 44.5 % (ref 39.0–52.0)
Hemoglobin: 14.8 g/dL (ref 13.0–17.0)
Lymphocytes Relative: 25.2 % (ref 12.0–46.0)
Lymphs Abs: 2.6 10*3/uL (ref 0.7–4.0)
MCHC: 33.2 g/dL (ref 30.0–36.0)
MCV: 90 fL (ref 78.0–100.0)
Monocytes Absolute: 0.6 10*3/uL (ref 0.1–1.0)
Monocytes Relative: 5.5 % (ref 3.0–12.0)
Neutro Abs: 7.1 10*3/uL (ref 1.4–7.7)
Neutrophils Relative %: 68.7 % (ref 43.0–77.0)
Platelets: 259 10*3/uL (ref 150.0–400.0)
RBC: 4.95 Mil/uL (ref 4.22–5.81)
RDW: 12.1 % (ref 11.5–15.5)
WBC: 10.3 10*3/uL (ref 4.0–10.5)

## 2023-08-28 LAB — BASIC METABOLIC PANEL
BUN: 15 mg/dL (ref 6–23)
CO2: 29 meq/L (ref 19–32)
Calcium: 9.9 mg/dL (ref 8.4–10.5)
Chloride: 99 meq/L (ref 96–112)
Creatinine, Ser: 1.29 mg/dL (ref 0.40–1.50)
GFR: 69.85 mL/min (ref >60.00)
Glucose, Bld: 86 mg/dL (ref 70–99)
Potassium: 3.8 meq/L (ref 3.5–5.1)
Sodium: 137 meq/L (ref 135–145)

## 2023-08-28 LAB — HEPATIC FUNCTION PANEL
ALT: 16 U/L (ref 0–53)
AST: 23 U/L (ref 0–37)
Albumin: 4.8 g/dL (ref 3.5–5.2)
Alkaline Phosphatase: 47 U/L (ref 39–117)
Bilirubin, Direct: 0.2 mg/dL (ref 0.0–0.3)
Total Bilirubin: 0.9 mg/dL (ref 0.2–1.2)
Total Protein: 7.3 g/dL (ref 6.0–8.3)

## 2023-08-28 LAB — TSH: TSH: 1.82 u[IU]/mL (ref 0.35–5.50)

## 2023-08-28 NOTE — Patient Instructions (Signed)
Testosterone Test Why am I having this test? Testosterone is a hormone made by the adrenal glands in the abdomen in both males and females. In males, it is also made by the testicles. Starting at puberty, testosterone stimulates the development of secondary sex characteristics in males. This includes a deeper voice, muscle and body hair growth, and penis enlargement. In females, testosterone is also produced in the ovaries. The male body converts testosterone into estradiol, the main male sex hormone. An abnormal level of testosterone can cause health problems in both males and females. You may have this test if your health care provider suspects that an abnormal testosterone level is causing or contributing to other health problems. In males, an abnormally low testosterone level can cause: Inability to have children. Trouble getting or maintaining an erection. Delayed puberty. In females, an abnormally high testosterone level can cause: Infertility. Polycystic ovary syndrome. Development of masculine features. What is being tested? This test measures the amount of total testosterone in your blood. What kind of sample is taken?  A blood sample is required for this test. It is usually collected by inserting a needle into a blood vessel. The sample is most often collected in the morning because that is when testosterone is usually the highest. How do I prepare for this test? Follow instructions from your health care provider about changing or stopping your regular medicines. Tell a health care provider about: Any allergies you have. All medicines you are taking, including vitamins, herbs, eye drops, creams, and over-the-counter medicines. Any blood disorders you have. Any surgeries you have had. Any medical conditions you have. Whether you are pregnant or may be pregnant. How are the results reported? Your test results will be reported as a value that indicates how much testosterone is  in your blood. This will be given as nanograms of testosterone per deciliter of blood (ng/dL). Your health care provider will compare your test results to normal ranges that were established after testing a large group of people (reference ranges). Reference ranges may vary among labs and hospitals. For this test, common reference ranges for total testosterone are: Male: 7 months to 40 years old: less than 30 ng/dL. 29-97 years old: less than 300 ng/dL. 14-74 years old: 170-540 ng/dL. 57-20 years old: 250-910 ng/dL. 20 years and older: 280-1,080 ng/dL. Male: 7 months to 40 years old: less than 30 ng/dL. 44-12 years old: less than 40 ng/dL. 91-46 years old: less than 60 ng/dL. 18-70 years old: less than 70 ng/dL. 20 years and older: less than 70 ng/dL. What do the results mean? A result that is within your reference range means that you have a normal amount of testosterone in your blood. In males: A high testosterone level may mean that you: Have certain types of tumors. Have an overactive thyroid gland (hyperthyroidism). Currently use anabolic steroids or used anabolic steroids in the past. Have an inherited disorder that affects the adrenal glands (congenital adrenal hyperplasia). Are starting puberty early (precocious puberty). A low testosterone level may mean that you: Have certain genetic diseases. Have had certain viral infections, such as mumps. Have a condition that affects the pituitary gland. Have injured your testicles. In females: A high testosterone level may mean that you have: Certain types of tumors, such as ovarian or adrenal gland tumors. An inherited disorder that affects certain cells in the adrenal glands (congenital adrenal hyperplasia). Polycystic ovary syndrome. A low testosterone level usually will not cause health problems. Talk with your health care provider  about what your results mean. Questions to ask your health care provider Ask your health care  provider, or the department that is doing the test: When will my results be ready? How will I get my results? What are my treatment options? What other tests do I need? What are my next steps? Summary Testosterone is a hormone made by the adrenal glands in the abdomen in both males and females. In males, it is also made by the testicles. Starting at puberty, testosterone stimulates the development of secondary sex characteristics in males. In females, testosterone is also produced in the ovaries. The male body converts testosterone into estradiol, the main male sex hormone. An abnormal level of testosterone can cause health issues in both males and females. You may have this test if your health care provider suspects that an abnormal testosterone level is causing or contributing to other health problems. This information is not intended to replace advice given to you by your health care provider. Make sure you discuss any questions you have with your health care provider. Document Revised: 08/01/2020 Document Reviewed: 08/01/2020 Elsevier Patient Education  2024 [employer].

## 2023-08-28 NOTE — Progress Notes (Signed)
Subjective:  Patient ID: Caleb Rowe, male    DOB: Sep 08, 1986  Age: 37 y.o. MRN: [id number]  CC: Depression   HPI NAVARRE BIELER presents for f/up ----  Discussed the use of AI scribe software for clinical note transcription with the patient, who gave verbal consent to proceed.  History of Present Illness   The patient, on a regimen of Lamotrigine and Sertraline, reports a decrease in libido over the past few weeks. He denies any issues with erectile function. He has not had his testosterone levels checked previously but expresses interest in doing so, suspecting it may be contributing to his decreased sex drive. He reports no issues with energy levels.  From a psychiatric perspective, the patient reports doing well on his current medication regimen. He has recently quit drinking alcohol, with tomorrow marking one month of sobriety. He reports no withdrawal symptoms despite daily drinking for the past four years. He has a history of rehab for substances other than alcohol.  The patient denies any issues with weight, appetite, urination, or bowel movements. He continues to smoke, but not cigarettes. He has been taking Lamotrigine 25mg  twice daily and expresses hesitation about increasing the dose due to concerns about further decreasing his libido. He reports feeling great overall on his current regimen.       Outpatient Medications Prior to Visit  Medication Sig Dispense Refill   lamoTRIgine (LAMICTAL) 25 MG tablet Take 1 tablet (25 mg total) by mouth 2 (two) times daily. 180 tablet 0   sertraline (ZOLOFT) 100 MG tablet Take 1 tablet (100 mg total) by mouth daily. 90 tablet 0   No facility-administered medications prior to visit.    ROS Review of Systems  Constitutional:  Negative for diaphoresis and fatigue.  HENT: Negative.    Eyes: Negative.  Negative for visual disturbance.  Respiratory:  Negative for cough, chest tightness, shortness of breath and wheezing.    Cardiovascular:  Negative for chest pain, palpitations and leg swelling.  Gastrointestinal:  Negative for abdominal pain, diarrhea, nausea and vomiting.  Endocrine: Negative.   Genitourinary: Negative.  Negative for difficulty urinating, penile pain, penile swelling and scrotal swelling.  Musculoskeletal: Negative.  Negative for arthralgias.  Skin: Negative.   Neurological: Negative.  Negative for dizziness.  Hematological:  Negative for adenopathy. Does not bruise/bleed easily.  Psychiatric/Behavioral: Negative.  Negative for decreased concentration and dysphoric mood. The patient is not nervous/anxious.     Objective:  BP 126/76 (BP Location: Right Arm, Patient Position: Sitting, Cuff Size: Large)   Pulse 65   Temp 97.9 F (36.6 C) (Oral)   Ht 5\' 11"  (1.803 m)   Wt 169 lb (76.7 kg)   SpO2 96%   BMI 23.57 kg/m   BP Readings from Last 3 Encounters:  08/28/23 126/76  05/09/22 124/76  11/27/21 132/84    Wt Readings from Last 3 Encounters:  08/28/23 169 lb (76.7 kg)  05/09/22 162 lb (73.5 kg)  11/27/21 171 lb (77.6 kg)    Physical Exam Vitals reviewed.  HENT:     Mouth/Throat:     Mouth: Mucous membranes are moist.  Eyes:     General: No scleral icterus.    Conjunctiva/sclera: Conjunctivae normal.  Cardiovascular:     Rate and Rhythm: Normal rate and regular rhythm.     Heart sounds: No murmur heard.    No friction rub. No gallop.  Pulmonary:     Effort: Pulmonary effort is normal.     Breath sounds:  No stridor. No wheezing, rhonchi or rales.  Abdominal:     General: Abdomen is flat.     Palpations: There is no mass.     Tenderness: There is no abdominal tenderness. There is no guarding.     Hernia: No hernia is present.  Musculoskeletal:        General: Normal range of motion.     Cervical back: Neck supple.     Right lower leg: No edema.     Left lower leg: No edema.  Lymphadenopathy:     Cervical: No cervical adenopathy.  Skin:    General: Skin is dry.   Neurological:     General: No focal deficit present.     Mental Status: He is alert. Mental status is at baseline.  Psychiatric:        Mood and Affect: Mood normal.        Behavior: Behavior normal.        Thought Content: Thought content normal.        Judgment: Judgment normal.     Lab Results  Component Value Date   WBC 10.3 08/28/2023   HGB 14.8 08/28/2023   HCT 44.5 08/28/2023   PLT 259.0 08/28/2023   GLUCOSE 86 08/28/2023   CHOL 197 12/06/2020   TRIG 53.0 12/06/2020   HDL 85.30 12/06/2020   LDLCALC 101 (H) 12/06/2020   ALT 16 08/28/2023   AST 23 08/28/2023   NA 137 08/28/2023   K 3.8 08/28/2023   CL 99 08/28/2023   CREATININE 1.29 08/28/2023   BUN 15 08/28/2023   CO2 29 08/28/2023   TSH 1.82 08/28/2023    US SCROTUM W/DOPPLER  Result Date: 02/02/2022 CLINICAL DATA:  Bilateral scrotal pain. Remote history left varicocele. Scrotal pain is left-greater-than-right. Denies palpable mass. EXAM: SCROTAL ULTRASOUND DOPPLER ULTRASOUND OF THE TESTICLES TECHNIQUE: Complete ultrasound examination of the testicles, epididymis, and other scrotal structures was performed. Color and spectral Doppler ultrasound were also utilized to evaluate blood flow to the testicles. COMPARISON:  None. FINDINGS: Right testicle Measurements: 5.6 x 2.5 x 3.1 cm. No mass or microlithiasis visualized. There is a 1.1 mm calcification in the inferior pole. Left testicle Measurements: 4.5 x 2.4 x 3.0 cm. No mass or microlithiasis visualized. Right epididymis: Normal in size and appearance apart from a single 6 mm simple cyst in the epididymal head. Left epididymis:  Normal in size and appearance. Hydrocele:  None visualized. 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 Left scrotal veins measure up 3.8 mm with Valsalva. Pulsed Doppler interrogation of both testes demonstrates normal low resistance arterial and venous waveforms bilaterally. IMPRESSION: 1. No evidence of  testicular mass or torsion. 2. 1 mm calcification lower pole right testicle without microlithiasis. 3. Left-greater-than-right bilateral varicoceles.  No hydroceles. 4. 6 mm left epididymal head simple cyst. Electronically Signed   By: Almira Bar M.D.   On: 02/02/2022 21:18    Assessment & Plan:   Low libido- Labs are negative for secondary causes. -     TSH; Future -     Testosterone Total,Free,Bio, Males; Future -     Prolactin; Future -     CBC with Differential/Platelet; Future -     Basic metabolic panel; Future -     Hepatic function panel; Future  Current mild episode of major depressive disorder without prior episode (HCC) -     TSH; Future -     CBC with Differential/Platelet; Future -     Basic metabolic panel;  Future -     Hepatic function panel; Future -     lamoTRIgine; Take 1 tablet (25 mg total) by mouth 2 (two) times daily.  Dispense: 180 tablet; Refill: 1 -     Sertraline HCl; Take 1 tablet (100 mg total) by mouth daily.  Dispense: 90 tablet; Refill: 1     Follow-up: Return in about 6 months (around 02/26/2024).  Sanda Linger, MD

## 2023-08-29 LAB — TESTOSTERONE TOTAL,FREE,BIO, MALES
Albumin: 4.7 g/dL (ref 3.6–5.1)
Sex Hormone Binding: 56 nmol/L — ABNORMAL HIGH (ref 10–50)
Testosterone, Bioavailable: 106.3 ng/dL — ABNORMAL LOW (ref 110.0–575.0)
Testosterone, Free: 49.6 pg/mL (ref 46.0–224.0)
Testosterone: 580 ng/dL (ref 250–827)

## 2023-08-29 LAB — PROLACTIN: Prolactin: 8.5 ng/mL (ref 2.0–18.0)

## 2023-09-01 MED ORDER — LAMOTRIGINE 25 MG PO TABS
25.0000 mg | ORAL_TABLET | Freq: Two times a day (BID) | ORAL | 1 refills | Status: DC
Start: 2023-09-01 — End: 2023-10-09

## 2023-09-01 MED ORDER — SERTRALINE HCL 100 MG PO TABS
100.0000 mg | ORAL_TABLET | Freq: Every day | ORAL | 1 refills | Status: DC
Start: 2023-09-01 — End: 2024-08-07

## 2023-09-03 ENCOUNTER — Telehealth: Payer: Self-pay | Admitting: Internal Medicine

## 2023-09-03 ENCOUNTER — Telehealth: Payer: Self-pay

## 2023-09-03 NOTE — Telephone Encounter (Signed)
Please Advise

## 2023-09-03 NOTE — Telephone Encounter (Signed)
Unable to reach patient. LMTRC  

## 2023-09-03 NOTE — Telephone Encounter (Signed)
I called the patients wife and advised her of your comments in regards to his lab results. She wants to know if he does have a "liver disease" diagnosis ? If so what's the next steps that they need to take.

## 2023-09-03 NOTE — Telephone Encounter (Signed)
Pt spouse calls and states that Caleb Rowe doesn't have insurance at the moment but was wondering since (additional lab test and an MRI) has to be done, can she take care of those services (self pay)? & if so can it be estimated so she can know how much it will be. Please advise, Thanks

## 2023-09-05 ENCOUNTER — Other Ambulatory Visit: Payer: Self-pay | Admitting: Internal Medicine

## 2023-09-05 DIAGNOSIS — R7989 Other specified abnormal findings of blood chemistry: Secondary | ICD-10-CM | POA: Insufficient documentation

## 2023-09-12 NOTE — Telephone Encounter (Signed)
Patient and girlfriend is aware. I advised them I would get an estimate for them but she requested another appointment to discuss other options. Patient has been scheduled.

## 2023-10-09 ENCOUNTER — Encounter: Payer: Self-pay | Admitting: Internal Medicine

## 2023-10-09 ENCOUNTER — Ambulatory Visit (INDEPENDENT_AMBULATORY_CARE_PROVIDER_SITE_OTHER): Payer: Self-pay | Admitting: Internal Medicine

## 2023-10-09 VITALS — BP 120/78 | HR 68 | Temp 97.9°F | Ht 71.0 in | Wt 168.8 lb

## 2023-10-09 DIAGNOSIS — F320 Major depressive disorder, single episode, mild: Secondary | ICD-10-CM

## 2023-10-09 DIAGNOSIS — R7989 Other specified abnormal findings of blood chemistry: Secondary | ICD-10-CM

## 2023-10-09 MED ORDER — LAMOTRIGINE 50 MG PO TBDP: 50.0000 mg | ORAL_TABLET | Freq: Two times a day (BID) | ORAL | 1 refills | Status: DC

## 2023-10-09 NOTE — Progress Notes (Signed)
Subjective:  Patient ID: Caleb Rowe, male    DOB: 1986/04/23  Age: 37 y.o. MRN: [id number]  CC: Follow-up   HPI Caleb Rowe presents for f/up ----  Discussed the use of AI scribe software for clinical note transcription with the patient, who gave verbal consent to proceed.  History of Present Illness   The patient, with a history of prior steroid use, presents with concerns about his liver health due to elevated sex hormone binding globulin levels. He denies any symptoms of liver disease and overall feels well. He has noticed an increase in urination frequency, which he attributes to increased water intake after quitting alcohol. He denies any dizziness or lightheadedness.  The patient also reports a decrease in his libido, which he attributes to the demands of parenting two young children. He has noticed a decrease in his energy levels, which he attributes to aging and early morning work hours. He denies any changes in weight or appetite.  The patient has been on a stable regimen of lamotrigine and sertraline. However, since quitting alcohol, he has noticed increased irritability and lack of patience. He denies any violent tendencies or desire to act on his irritability. He reports good sleep quality, an improvement from when he was drinking alcohol.  The patient has a history of steroid use, which he discontinued approximately 11-12 years ago. He has noticed increased fatigue since discontinuing the steroids, but he does not feel the need to restart them. He denies any symptoms of chronic fatigue.  The patient works in [employer] and reports occasional work-related anxiety, especially when under time pressure. He denies any issues with focus or distraction at work. He is open to rechecking his sex hormone binding globulin levels and considering a dose increase of lamotrigine to manage his mood symptoms.       Outpatient Medications Prior to Visit  Medication Sig Dispense Refill    sertraline (ZOLOFT) 100 MG tablet Take 1 tablet (100 mg total) by mouth daily. 90 tablet 1   lamoTRIgine (LAMICTAL) 25 MG tablet Take 1 tablet (25 mg total) by mouth 2 (two) times daily. 180 tablet 1   No facility-administered medications prior to visit.    ROS Review of Systems  Constitutional: Negative.  Negative for appetite change, fatigue and unexpected weight change.  HENT: Negative.    Eyes: Negative.   Respiratory:  Negative for chest tightness, shortness of breath and wheezing.   Cardiovascular:  Negative for chest pain, palpitations and leg swelling.  Gastrointestinal:  Negative for abdominal pain, diarrhea, nausea and vomiting.  Endocrine: Negative.   Genitourinary: Negative.  Negative for difficulty urinating.  Musculoskeletal: Negative.  Negative for arthralgias.  Skin: Negative.   Neurological:  Negative for dizziness and weakness.  Hematological:  Negative for adenopathy. Does not bruise/bleed easily.  Psychiatric/Behavioral: Negative.      Objective:  BP 120/78   Pulse 68   Temp 97.9 F (36.6 C) (Oral)   Ht 5\' 11"  (1.803 m)   Wt 168 lb 12.8 oz (76.6 kg)   SpO2 99%   BMI 23.54 kg/m   BP Readings from Last 3 Encounters:  10/09/23 120/78  08/28/23 126/76  05/09/22 124/76    Wt Readings from Last 3 Encounters:  10/09/23 168 lb 12.8 oz (76.6 kg)  08/28/23 169 lb (76.7 kg)  05/09/22 162 lb (73.5 kg)    Physical Exam Vitals reviewed.  HENT:     Mouth/Throat:     Mouth: Mucous membranes are moist.  Eyes:     General: No scleral icterus.    Conjunctiva/sclera: Conjunctivae normal.  Cardiovascular:     Rate and Rhythm: Normal rate and regular rhythm.     Heart sounds: No murmur heard.    No friction rub. No gallop.  Pulmonary:     Effort: Pulmonary effort is normal.     Breath sounds: No stridor. No wheezing, rhonchi or rales.  Abdominal:     General: Abdomen is flat.     Palpations: There is no mass.     Tenderness: There is no abdominal  tenderness. There is no guarding.     Hernia: No hernia is present.  Musculoskeletal:        General: Normal range of motion.     Cervical back: Neck supple.     Right lower leg: No edema.     Left lower leg: No edema.  Lymphadenopathy:     Cervical: No cervical adenopathy.  Skin:    General: Skin is warm and dry.  Neurological:     General: No focal deficit present.     Mental Status: He is alert. Mental status is at baseline.  Psychiatric:        Mood and Affect: Mood normal.        Behavior: Behavior normal.     Lab Results  Component Value Date   WBC 10.3 08/28/2023   HGB 14.8 08/28/2023   HCT 44.5 08/28/2023   PLT 259.0 08/28/2023   GLUCOSE 86 08/28/2023   CHOL 197 12/06/2020   TRIG 53.0 12/06/2020   HDL 85.30 12/06/2020   LDLCALC 101 (H) 12/06/2020   ALT 16 08/28/2023   AST 23 08/28/2023   NA 137 08/28/2023   K 3.8 08/28/2023   CL 99 08/28/2023   CREATININE 1.29 08/28/2023   BUN 15 08/28/2023   CO2 29 08/28/2023   TSH 1.82 08/28/2023    US SCROTUM W/DOPPLER  Result Date: 02/02/2022 CLINICAL DATA:  Bilateral scrotal pain. Remote history left varicocele. Scrotal pain is left-greater-than-right. Denies palpable mass. EXAM: SCROTAL ULTRASOUND DOPPLER ULTRASOUND OF THE TESTICLES TECHNIQUE: Complete ultrasound examination of the testicles, epididymis, and other scrotal structures was performed. Color and spectral Doppler ultrasound were also utilized to evaluate blood flow to the testicles. COMPARISON:  None. FINDINGS: Right testicle Measurements: 5.6 x 2.5 x 3.1 cm. No mass or microlithiasis visualized. There is a 1.1 mm calcification in the inferior pole. Left testicle Measurements: 4.5 x 2.4 x 3.0 cm. No mass or microlithiasis visualized. Right epididymis: Normal in size and appearance apart from a single 6 mm simple cyst in the epididymal head. Left epididymis:  Normal in size and appearance. Hydrocele:  None visualized. Varicocele: There are bilateral scrotal  varicoceles, left-greater-than-right. Left scrotal veins measure up 3.8 mm with Valsalva. Pulsed Doppler interrogation of both testes demonstrates normal low resistance arterial and venous waveforms bilaterally. IMPRESSION: 1. No evidence of testicular mass or torsion. 2. 1 mm calcification lower pole right testicle without microlithiasis. 3. Left-greater-than-right bilateral varicoceles.  No hydroceles. 4. 6 mm left epididymal head simple cyst. Electronically Signed   By: Almira Bar M.D.   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 -     Testosterone, Free, Total, SHBG; Future  Current mild episode of major depressive disorder without prior episode (HCC) - Will increase the lamotrigine dose. -     lamoTRIgine; Take 1 tablet (50 mg total)  by mouth 2 (two) times daily.  Dispense: 180 tablet; Refill: 1     Follow-up: Return in about 6 months (around 04/07/2024).  Sanda Linger, MD

## 2023-10-09 NOTE — Patient Instructions (Signed)
Lamotrigine Tablets What is this medication? LAMOTRIGINE (la MOE Patrecia Pace) prevents and controls seizures in people with epilepsy. It may also be used to treat bipolar disorder. It works by calming overactive nerves in your body. This medicine may be used for other purposes; ask your health care provider or pharmacist if you have questions. COMMON BRAND NAME(S): Lamictal, Subvenite What should I tell my care team before I take this medication? They need to know if you have any of these conditions: Heart disease History of irregular heartbeat Immune system problems Kidney disease Liver disease Low levels of folic acid in the blood Lupus Mental health condition Suicidal thoughts, plans, or attempt by you or a family member An unusual or allergic reaction to lamotrigine, other medications, foods, dyes, or preservatives Pregnant or trying to get pregnant Breastfeeding How should I use this medication? Take this medication by mouth with a glass of water. Follow the directions on the prescription label. Do not chew these tablets. If this medication upsets your stomach, take it with food or milk. Take your doses at regular intervals. Do not take your medication more often than directed. A special MedGuide will be given to you by the pharmacist with each new prescription and refill. Be sure to read this information carefully each time. Talk to your care team about the use of this medication in children. While this medication may be prescribed for children as young as 2 years for selected conditions, precautions do apply. Overdosage: If you think you have taken too much of this medicine contact a poison control center or emergency room at once. NOTE: This medicine is only for you. Do not share this medicine with others. What if I miss a dose? If you miss a dose, take it as soon as you can. If it is almost time for your next dose, take only that dose. Do not take double or extra doses. What may  interact with this medication? Atazanavir Certain medications for irregular heartbeat Certain medications for seizures, such as carbamazepine, phenobarbital, phenytoin, primidone, or valproic acid Estrogen or progestin hormones Lopinavir Rifampin Ritonavir This list may not describe all possible interactions. Give your health care provider a list of all the medicines, herbs, non-prescription drugs, or dietary supplements you use. Also tell them if you smoke, drink alcohol, or use illegal drugs. Some items may interact with your medicine. What should I watch for while using this medication? Visit your care team for regular checks on your progress. If you take this medication for seizures, wear a Medic Alert bracelet or necklace. Carry an identification card with information about your condition, medications, and care team. It is important to take this medication exactly as directed. When first starting treatment, your dose will need to be adjusted slowly. It may take weeks or months before your dose is stable. You should contact your care team if your seizures get worse or if you have any new types of seizures. Do not stop taking this medication unless instructed by your care team. Stopping your medication suddenly can increase your seizures or their severity. This medication may cause serious skin reactions. They can happen weeks to months after starting the medication. Contact your care team right away if you notice fevers or flu-like symptoms with a rash. The rash may be red or purple and then turn into blisters or peeling of the skin. You may also notice a red rash with swelling of the face, lips, or lymph nodes in your neck or under your  arms. This medication may affect your coordination, reaction time, or judgment. Do not drive or operate machinery until you know how this medication affects you. Sit up or stand slowly to reduce the risk of dizzy or fainting spells. Drinking alcohol with this  medication can increase the risk of these side effects. If you are taking this medication for bipolar disorder, it is important to report any changes in your mood to your care team. If your condition gets worse, you get mentally depressed, feel very hyperactive or manic, have difficulty sleeping, or have thoughts of hurting yourself or committing suicide, you need to get help from your care team right away. If you are a caregiver for someone taking this medication for bipolar disorder, you should also report these behavioral changes right away. The use of this medication may increase the chance of suicidal thoughts or actions. Pay special attention to how you are responding while on this medication. Your mouth may get dry. Chewing sugarless gum or sucking hard candy and drinking plenty of water may help. Contact your care team if the problem does not go away or is severe. If you become pregnant while using this medication, you may enroll in the [place] American Antiepileptic Drug Pregnancy Registry by calling [phone]. This registry collects information about the safety of antiepileptic medication use during pregnancy. This medication may cause a decrease in folic acid. You should make sure that you get enough folic acid while you are taking this medication. Discuss the foods you eat and the vitamins you take with your care team. What side effects may I notice from receiving this medication? Side effects that you should report to your care team as soon as possible: Allergic reactions--skin rash, itching, hives, swelling of the face, lips, tongue, or throat Change in vision Fever, neck pain or stiffness, sensitivity to light, headache, nausea, vomiting, confusion Heart rhythm changes--fast or irregular heartbeat, dizziness, feeling faint or lightheaded, chest pain, trouble breathing Infection--fever, chills, cough, or sore throat Liver injury--right upper belly pain, loss of appetite, nausea,  light-colored stool, dark yellow or brown urine, yellowing skin or eyes, unusual weakness or fatigue Low red blood cell count--unusual weakness or fatigue, dizziness, headache, trouble breathing Rash, fever, and swollen lymph nodes Redness, blistering, peeling or loosening of the skin, including inside the mouth Thoughts of suicide or self-harm, worsening mood, or feelings of depression Unusual bruising or bleeding Side effects that usually do not require medical attention (report to your care team if they continue or are bothersome): Diarrhea Dizziness Drowsiness Headache Nausea Stomach pain Tremors or shaking This list may not describe all possible side effects. Call your doctor for medical advice about side effects. You may report side effects to FDA at 1-800-FDA-1088. Where should I keep my medication? Keep out of the reach of children and pets. Store at [employer] [address] (77 degrees F). Protect from light. Get rid of any unused medication after the expiration date. To get rid of medications that are no longer needed or have expired: Take the medication to a medication take-back program. Check with your pharmacy or law enforcement to find a location. If you cannot return the medication, check the label or package insert to see if the medication should be thrown out in the garbage or flushed down the toilet. If you are not sure, ask your care team. If it is safe to put it in the trash, empty the medication out of the container. Mix the medication with cat litter, dirt, coffee grounds,  or other unwanted substance. Seal the mixture in a bag or container. Put it in the trash. NOTE: This sheet is a summary. It may not cover all possible information. If you have questions about this medicine, talk to your doctor, pharmacist, or health care provider.  2024 Elsevier/Gold Standard (2022-05-08 00:00:00)

## 2023-10-12 LAB — TESTOSTERONE, FREE, TOTAL, SHBG
Sex Hormone Binding: 60.6 nmol/L — ABNORMAL HIGH (ref 16.5–55.9)
Testosterone, Free: 5.3 pg/mL — ABNORMAL LOW (ref 8.7–25.1)
Testosterone: 531 ng/dL (ref 264–916)

## 2023-10-18 ENCOUNTER — Other Ambulatory Visit: Payer: Self-pay | Admitting: Internal Medicine

## 2023-10-18 DIAGNOSIS — R7989 Other specified abnormal findings of blood chemistry: Secondary | ICD-10-CM

## 2023-11-04 IMAGING — US US SCROTUM W/ DOPPLER COMPLETE
1 series · 13 of 25 positions shown · non-contrast
Comparison: None.

CLINICAL DATA: Bilateral scrotal pain. Remote history left
varicocele. Scrotal pain is left-greater-than-right. Denies palpable
mass.

EXAM:
SCROTAL ULTRASOUND
DOPPLER ULTRASOUND OF THE TESTICLES
TECHNIQUE: Complete ultrasound examination of the testicles, epididymis, and
other scrotal structures was performed. Color and spectral Doppler
ultrasound were also utilized to evaluate blood flow to the
testicles.

[Series 1: us scrotum w/ doppler complete · 0.06mm/px · 13 of 48 slices shown]
[im 1/48]
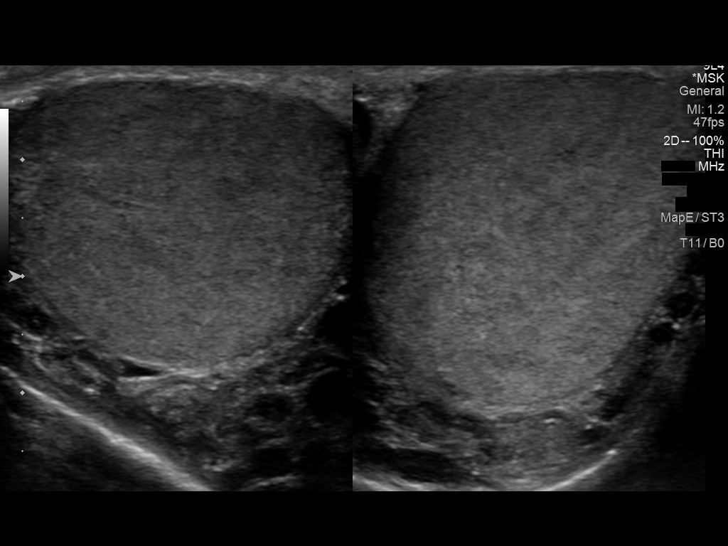
[im 4/48]
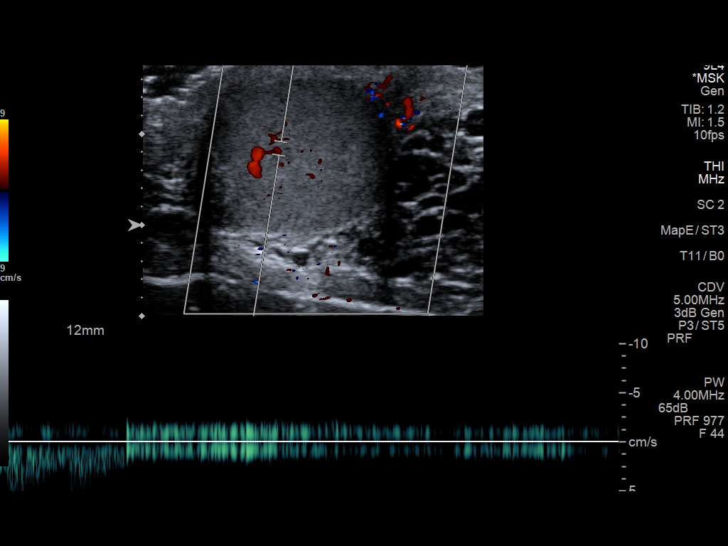
[im 8/48]
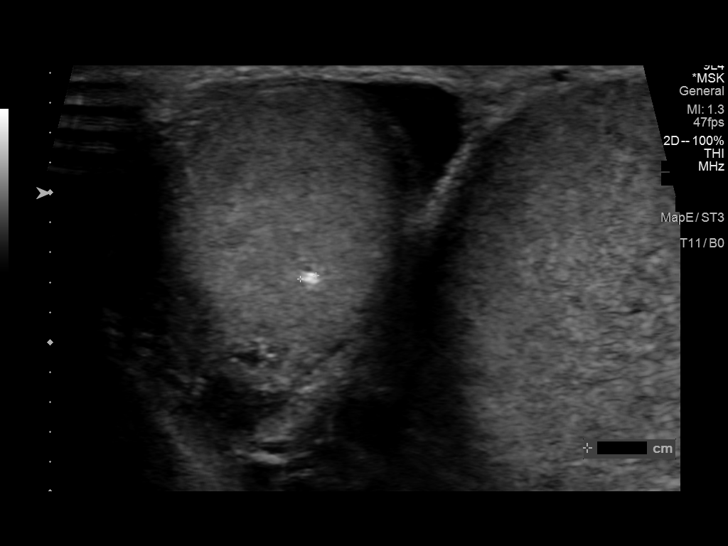
[im 12/48]
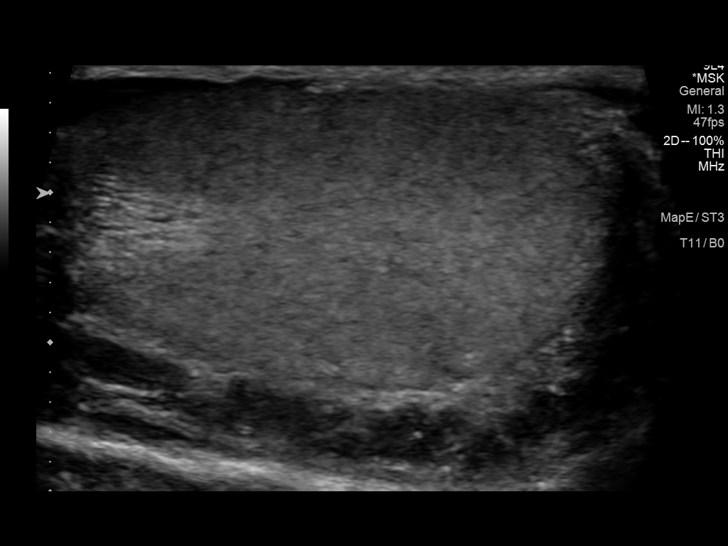
[im 16/48]
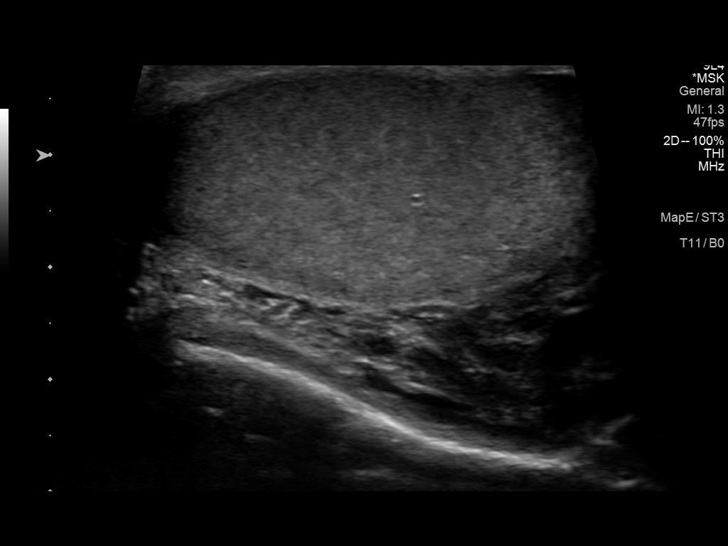
[im 20/48]
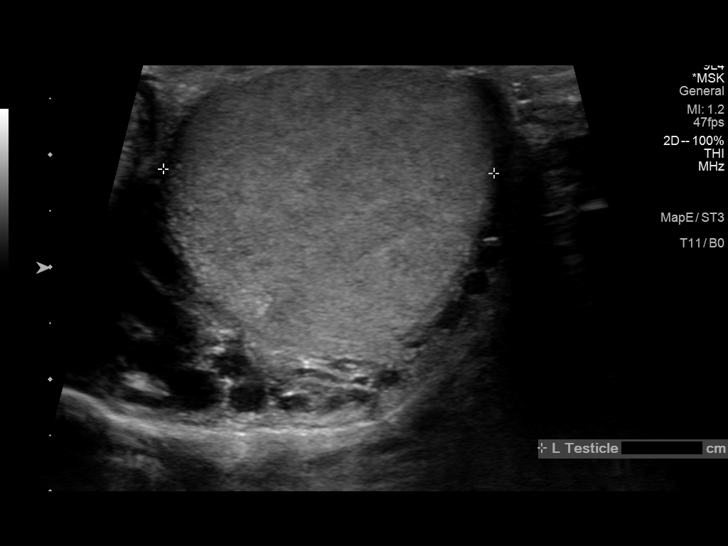
[im 24/48]
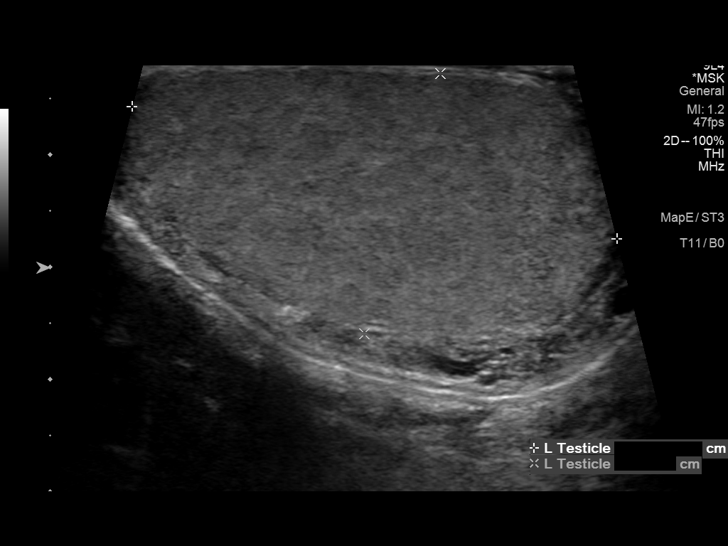
[im 28/48]
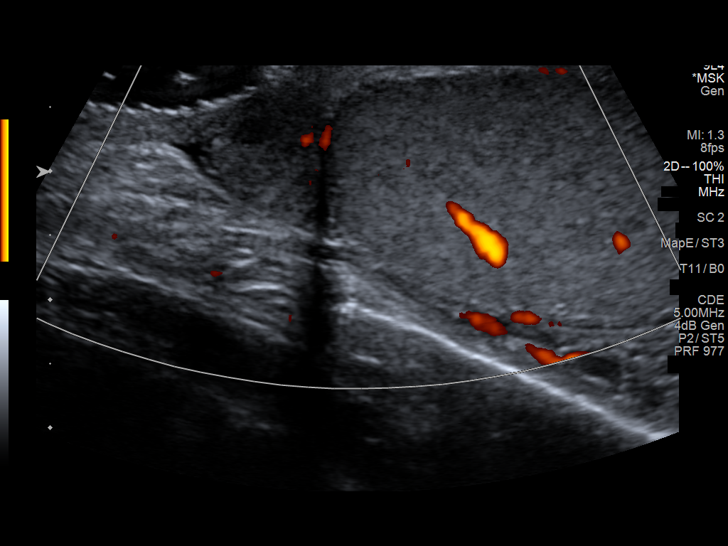
[im 32/48]
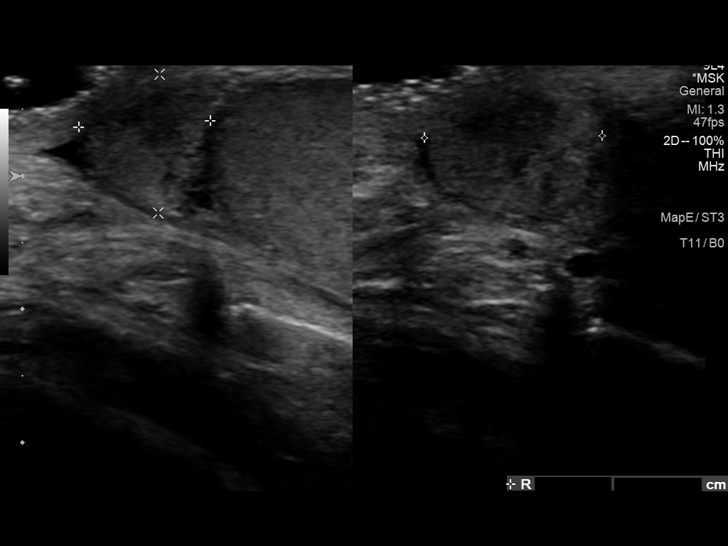
[im 36/48]
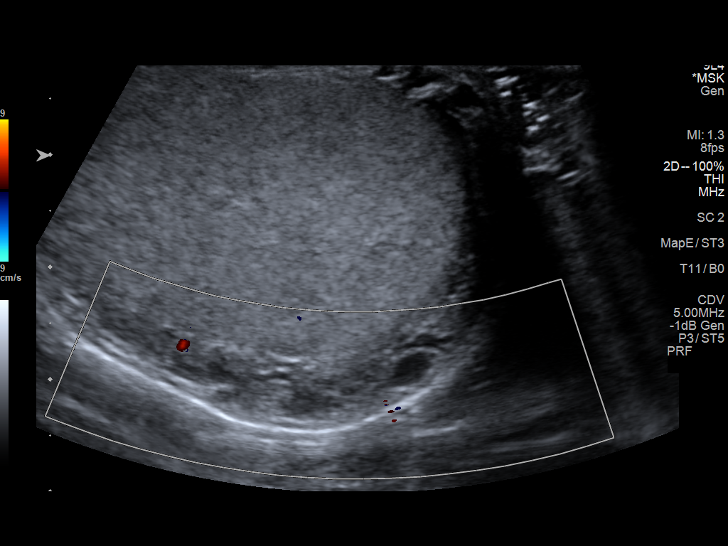
[im 40/48]
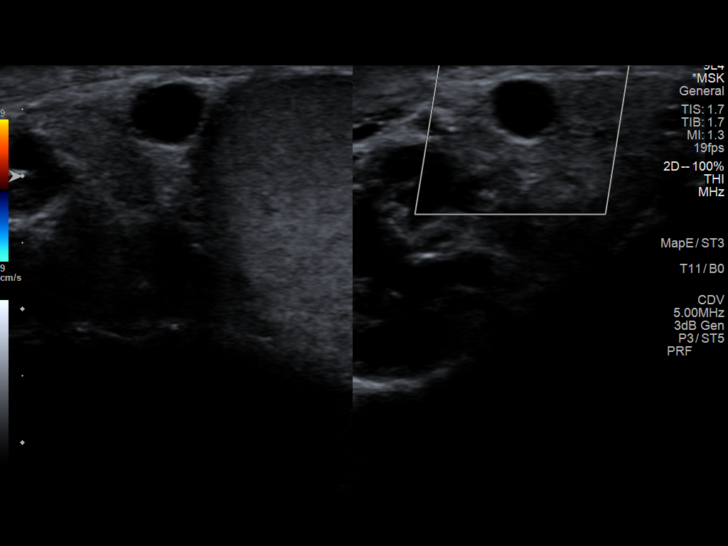
[im 44/48]
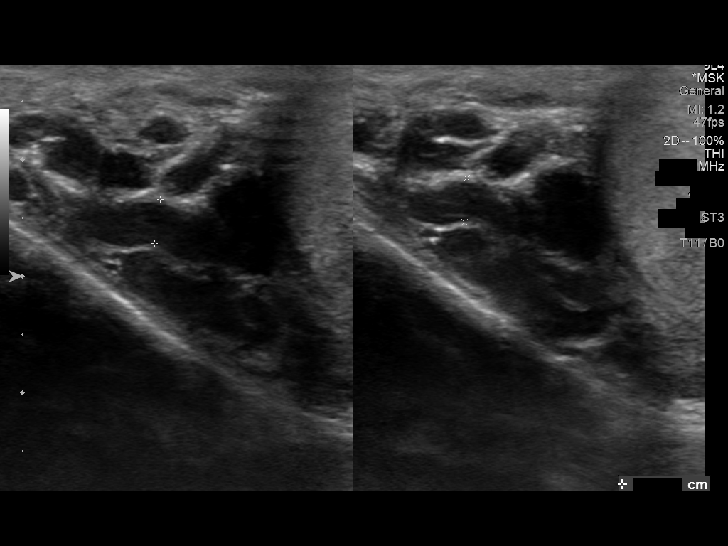
[im 48/48]
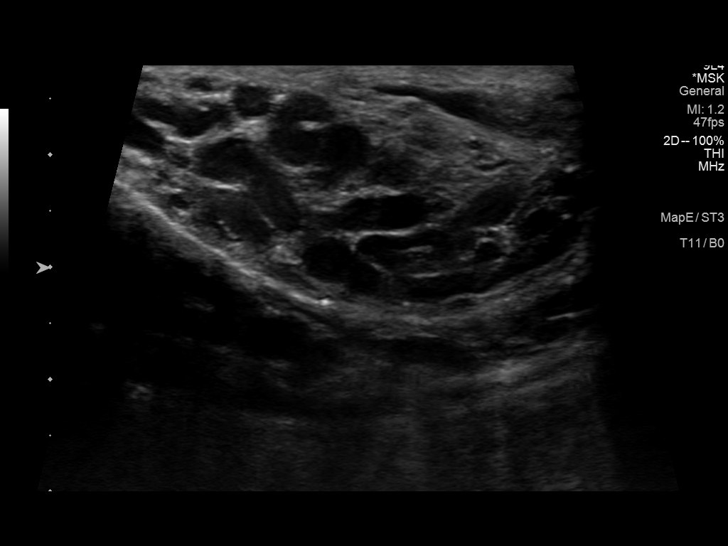

[13 of 25 positions shown; findings below may reference images not displayed]

FINDINGS: Right testicle

Measurements: 5.6 x 2.5 x 3.1 cm. No mass or microlithiasis
visualized. There is a 1.1 mm calcification in the inferior pole.

Left testicle

Measurements: 4.5 x 2.4 x 3.0 cm. No mass or microlithiasis
visualized.

Right epididymis: Normal in size and appearance apart from a single
6 mm simple cyst in the epididymal head.

Left epididymis:  Normal in size and appearance.

Hydrocele:  None visualized.

Varicocele: There are bilateral scrotal varicoceles,
left-greater-than-right. Left scrotal veins measure up 3.8 mm with
Valsalva.

Pulsed Doppler interrogation of both testes demonstrates normal low
resistance arterial and venous waveforms bilaterally.
IMPRESSION: 1. No evidence of testicular mass or torsion.
2. 1 mm calcification lower pole right testicle without
microlithiasis.
3. Left-greater-than-right bilateral varicoceles.  No hydroceles.
4. 6 mm left epididymal head simple cyst.

## 2024-06-16 ENCOUNTER — Other Ambulatory Visit: Payer: Self-pay | Admitting: Internal Medicine

## 2024-06-16 DIAGNOSIS — F320 Major depressive disorder, single episode, mild: Secondary | ICD-10-CM

## 2024-06-18 NOTE — Telephone Encounter (Addendum)
 Last OV 10/09/23, f/u 6 months Next OV not scheduled  Last refill 10/09/23 Qty #180/1  Rx changed to 50 mg on 10/08/24.   Pt past due for f/u visit, please reach out to assist with scheduling.

## 2024-06-29 ENCOUNTER — Other Ambulatory Visit: Payer: Self-pay | Admitting: Internal Medicine

## 2024-06-29 DIAGNOSIS — F320 Major depressive disorder, single episode, mild: Secondary | ICD-10-CM

## 2024-06-29 NOTE — Telephone Encounter (Unsigned)
 Copied from CRM #[id number]. Topic: Clinical - Medication Refill >> Jun 29, 2024  9:19 AM [place] M wrote: Medication: sertraline  (ZOLOFT ) 100 MG tablet  Has the patient contacted their pharmacy? Yes (Agent: If no, request that the patient contact the pharmacy for the refill. If patient does not wish to contact the pharmacy document the reason why and proceed with request.) (Agent: If yes, when and what did the pharmacy advise?)  This is the patient's preferred pharmacy:  [facility] - [place], [place] - [address] [place] [address] Phone: [phone] Fax: [phone]  Is this the correct pharmacy for this prescription? Yes If no, delete pharmacy and type the correct one.   Has the prescription been filled recently? Yes  Is the patient out of the medication? Yes  Has the patient been seen for an appointment in the last year OR does the patient have an upcoming appointment? Yes  Can we respond through MyChart? Yes  Agent: Please be advised that Rx refills may take up to 3 business days. We ask that you follow-up with your pharmacy.

## 2024-06-30 ENCOUNTER — Other Ambulatory Visit: Payer: Self-pay | Admitting: Internal Medicine

## 2024-06-30 DIAGNOSIS — F320 Major depressive disorder, single episode, mild: Secondary | ICD-10-CM

## 2024-07-28 ENCOUNTER — Other Ambulatory Visit: Payer: Self-pay | Admitting: Internal Medicine

## 2024-07-28 ENCOUNTER — Telehealth: Payer: Self-pay | Admitting: Internal Medicine

## 2024-07-28 DIAGNOSIS — F320 Major depressive disorder, single episode, mild: Secondary | ICD-10-CM

## 2024-07-28 NOTE — Telephone Encounter (Unsigned)
 Copied from CRM [id number]. Topic: Clinical - Medication Refill >> Jul 28, 2024  3:16 PM Shereese L wrote: Medication: sertraline  (ZOLOFT ) 100 MG tablet  Has the patient contacted their pharmacy? Yes (Agent: If no, request that the patient contact the pharmacy for the refill. If patient does not wish to contact the pharmacy document the reason why and proceed with request.) (Agent: If yes, when and what did the pharmacy advise?)  This is the patient's preferred pharmacy:  [facility] - [place], [place] - [address] [place] [address] Phone: [phone] Fax: [phone]  Is this the correct pharmacy for this prescription? Yes If no, delete pharmacy and type the correct one.   Has the prescription been filled recently? Yes  Is the patient out of the medication? Yes  Has the patient been seen for an appointment in the last year OR does the patient have an upcoming appointment? Yes  Can we respond through MyChart? Yes  Agent: Please be advised that Rx refills may take up to 3 business days. We ask that you follow-up with your pharmacy.

## 2024-07-28 NOTE — Telephone Encounter (Signed)
 Copied from CRM [id number]. Topic: Clinical - Medication Refill >> Jul 28, 2024  3:16 PM Shereese L wrote: Medication: sertraline  (ZOLOFT ) 100 MG tablet  Has the patient contacted their pharmacy? Yes (Agent: If no, request that the patient contact the pharmacy for the refill. If patient does not wish to contact the pharmacy document the reason why and proceed with request.) (Agent: If yes, when and what did the pharmacy advise?)  This is the patient's preferred pharmacy:  [facility] - [place], [place] - [address] [place] [address] Phone: [phone] Fax: [phone]  Is this the correct pharmacy for this prescription? Yes If no, delete pharmacy and type the correct one.   Has the prescription been filled recently? Yes  Is the patient out of the medication? Yes  Has the patient been seen for an appointment in the last year OR does the patient have an upcoming appointment? Yes  Can we respond through MyChart? Yes  Agent: Please be advised that Rx refills may take up to 3 business days. We ask that you follow-up with your pharmacy.

## 2024-07-30 ENCOUNTER — Other Ambulatory Visit: Payer: Self-pay | Admitting: Internal Medicine

## 2024-07-30 DIAGNOSIS — F320 Major depressive disorder, single episode, mild: Secondary | ICD-10-CM

## 2024-07-30 NOTE — Telephone Encounter (Signed)
 Copied from CRM [id number]. Topic: Clinical - Medication Refill >> Jul 30, 2024  2:59 PM Caleb Rowe wrote: Medication: sertraline  (ZOLOFT ) 100 MG tablet  Has the patient contacted their pharmacy? Yes (Agent: If no, request that the patient contact the pharmacy for the refill. If patient does not wish to contact the pharmacy document the reason why and proceed with request.) (Agent: If yes, when and what did the pharmacy advise?)  This is the patient's preferred pharmacy:  [facility] - [place], [place] - [address] [place] [address] Phone: [phone] Fax: [phone]  Is this the correct pharmacy for this prescription? Yes If no, delete pharmacy and type the correct one.   Has the prescription been filled recently? Yes  Is the patient out of the medication? Yes  Has the patient been seen for an appointment in the last year OR does the patient have an upcoming appointment? Yes  Can we respond through MyChart? Yes  Agent: Please be advised that Rx refills may take up to 3 business days. We ask that you follow-up with your pharmacy.

## 2024-08-06 ENCOUNTER — Encounter: Payer: Self-pay | Admitting: Internal Medicine

## 2024-08-07 ENCOUNTER — Telehealth: Payer: Self-pay | Admitting: Radiology

## 2024-08-07 ENCOUNTER — Other Ambulatory Visit: Payer: Self-pay | Admitting: Internal Medicine

## 2024-08-07 DIAGNOSIS — F320 Major depressive disorder, single episode, mild: Secondary | ICD-10-CM

## 2024-08-07 MED ORDER — LAMOTRIGINE 50 MG PO TBDP: 50.0000 mg | ORAL_TABLET | Freq: Two times a day (BID) | ORAL | 0 refills | Status: DC

## 2024-08-07 MED ORDER — SERTRALINE HCL 100 MG PO TABS: 100.0000 mg | ORAL_TABLET | Freq: Every day | ORAL | 0 refills | Status: DC

## 2024-08-07 NOTE — Telephone Encounter (Signed)
 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

## 2024-08-07 NOTE — Telephone Encounter (Signed)
 This is a duplicate message. I have sent multiple messages to Dr. Joshua.

## 2024-08-07 NOTE — Telephone Encounter (Signed)
 Copied from CRM #[id number]. Topic: Clinical - Medication Question >> Aug 07, 2024 10:55 AM Viola F wrote: Patient called to follow up on refill for the sertraline  (ZOLOFT ) 100 MG tablet - his next appt is 09/01/24 - I did explain to him that an appt was neccessary since he hasn't seen the provider since 09/2023. He will be completely out of medication tomorrow 08/08/24 and asked if this could please be filled today. He would like a call at [phone] once it is done so he could be aware.

## 2024-08-07 NOTE — Telephone Encounter (Signed)
 Copied from CRM #[id number]. Topic: Clinical - Prescription Issue >> Aug 07, 2024 10:48 AM Miller H wrote: Reason for CRM: Please call [phone] to speak with Rosina at pharmacy

## 2024-09-01 ENCOUNTER — Encounter: Payer: Self-pay | Admitting: Internal Medicine

## 2024-09-01 ENCOUNTER — Ambulatory Visit (INDEPENDENT_AMBULATORY_CARE_PROVIDER_SITE_OTHER): Payer: Self-pay | Admitting: Internal Medicine

## 2024-09-01 VITALS — BP 124/76 | HR 62 | Temp 98.4°F | Resp 16 | Ht 72.0 in | Wt 161.6 lb

## 2024-09-01 DIAGNOSIS — F320 Major depressive disorder, single episode, mild: Secondary | ICD-10-CM

## 2024-09-01 DIAGNOSIS — L989 Disorder of the skin and subcutaneous tissue, unspecified: Secondary | ICD-10-CM | POA: Insufficient documentation

## 2024-09-01 MED ORDER — LAMOTRIGINE 50 MG PO TBDP: 50.0000 mg | ORAL_TABLET | Freq: Two times a day (BID) | ORAL | 1 refills | Status: AC

## 2024-09-01 MED ORDER — SERTRALINE HCL 100 MG PO TABS: 100.0000 mg | ORAL_TABLET | Freq: Every day | ORAL | 1 refills | Status: AC

## 2024-09-01 NOTE — Progress Notes (Signed)
 Subjective:  Patient ID: Caleb Rowe, male    DOB: 1985/11/29  Age: 38 y.o. MRN: [id number]  CC: Follow-up   HPI [name] presents for f/up ----   Discussed the use of AI scribe software for clinical note transcription with the patient, who gave verbal consent to proceed.  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  He has been sober for one year as of September 17th, experiencing significant improvements in mental health, including reduced depression, anxiety, and temper. He has also lost weight since quitting alcohol, attributing this to reduced calorie and sugar intake, but does not find the weight loss concerning.  He has a skin lesion present for three to four years, which he wishes to have removed for cosmetic reasons. The lesion does not cause discomfort, and there are no other lesions. He has no family history of skin cancer.  No chest pain, abdominal pain, dizziness, lightheadedness, palpitations, or swelling. He is currently on medications without side effects.  He has not received any vaccines, including flu or COVID-19 vaccines, and declined lab work due to financial concerns, despite previous monitoring of liver function tests.  He works in Hospital doctor and mentions that he can manage the cold better than the heat. His wife is a Teacher, early years/pre at [employer].     Outpatient Medications Prior to Visit  Medication Sig Dispense Refill   lamoTRIgine  (LAMICTAL  ODT) 50 MG TBDP Take 1 tablet (50 mg total) by mouth 2 (two) times daily. 60 tablet 0   sertraline  (ZOLOFT ) 100 MG tablet Take 1 tablet (100 mg total) by mouth daily. 30 tablet 0   No facility-administered medications prior to visit.    ROS Review of Systems  Constitutional:  Negative for appetite change, chills, diaphoresis, fatigue and fever.  HENT: Negative.    Eyes: Negative.    Respiratory: Negative.  Negative for cough, chest tightness, shortness of breath and wheezing.   Cardiovascular:  Negative for chest pain, palpitations and leg swelling.  Gastrointestinal:  Negative for abdominal pain, constipation, diarrhea, nausea and vomiting.  Endocrine: Negative.   Genitourinary: Negative.  Negative for difficulty urinating, penile swelling and scrotal swelling.  Musculoskeletal: Negative.  Negative for arthralgias and myalgias.  Skin:  Negative for color change.  Neurological:  Negative for dizziness and weakness.  Hematological:  Negative for adenopathy. Does not bruise/bleed easily.  Psychiatric/Behavioral: Negative.      Objective:  BP 124/76 (BP Location: Left Arm, Patient Position: Sitting, Cuff Size: Normal)   Pulse 62   Temp 98.4 F (36.9 C) (Oral)   Resp 16   Ht 6' (1.829 m)   Wt 161 lb 9.6 oz (73.3 kg)   SpO2 98%   BMI 21.92 kg/m   BP Readings from Last 3 Encounters:  09/01/24 124/76  10/09/23 120/78  08/28/23 126/76    Wt Readings from Last 3 Encounters:  09/01/24 161 lb 9.6 oz (73.3 kg)  10/09/23 168 lb 12.8 oz (76.6 kg)  08/28/23 169 lb (76.7 kg)    Physical Exam Vitals reviewed.  HENT:     Nose: Nose normal.     Mouth/Throat:     Mouth: Mucous membranes are moist.  Eyes:     General: No scleral icterus.    Conjunctiva/sclera: Conjunctivae normal.  Cardiovascular:     Rate and Rhythm: Normal rate and regular rhythm.  Heart sounds: No murmur heard.    No friction rub. No gallop.  Pulmonary:     Effort: Pulmonary effort is normal.     Breath sounds: No stridor. No wheezing, rhonchi or rales.  Abdominal:     General: Abdomen is flat.     Palpations: There is no mass.     Tenderness: There is no abdominal tenderness. There is no guarding.     Hernia: No hernia is present.  Musculoskeletal:        General: Normal range of motion.     Cervical back: Neck supple.     Right lower leg: No edema.     Left lower leg: No edema.   Lymphadenopathy:     Cervical: No cervical adenopathy.  Skin:    General: Skin is warm and dry.     Coloration: Skin is not jaundiced or pale.     Findings: Lesion present. No rash.  Neurological:     Mental Status: He is alert.  Psychiatric:        Mood and Affect: Mood normal.        Behavior: Behavior normal.        Thought Content: Thought content normal.        Judgment: Judgment normal.     Lab Results  Component Value Date   WBC 10.3 08/28/2023   HGB 14.8 08/28/2023   HCT 44.5 08/28/2023   PLT 259.0 08/28/2023   GLUCOSE 86 08/28/2023   CHOL 197 12/06/2020   TRIG 53.0 12/06/2020   HDL 85.30 12/06/2020   LDLCALC 101 (H) 12/06/2020   ALT 16 08/28/2023   AST 23 08/28/2023   NA 137 08/28/2023   K 3.8 08/28/2023   CL 99 08/28/2023   CREATININE 1.29 08/28/2023   BUN 15 08/28/2023   CO2 29 08/28/2023   TSH 1.82 08/28/2023    US  SCROTUM W/DOPPLER Result Date: 02/02/2022 CLINICAL DATA:  Bilateral scrotal pain. Remote history left varicocele. Scrotal pain is left-greater-than-right. Denies palpable mass. EXAM: SCROTAL ULTRASOUND DOPPLER ULTRASOUND OF THE TESTICLES TECHNIQUE: Complete ultrasound examination of the testicles, epididymis, and other scrotal structures was performed. Color and spectral Doppler ultrasound were also utilized to evaluate blood flow to the testicles. COMPARISON:  None. FINDINGS: Right testicle Measurements: 5.6 x 2.5 x 3.1 cm. No mass or microlithiasis visualized. There is a 1.1 mm calcification in the inferior pole. Left testicle Measurements: 4.5 x 2.4 x 3.0 cm. No mass or microlithiasis visualized. Right epididymis: Normal in size and appearance apart from a single 6 mm simple cyst in the epididymal head. Left epididymis:  Normal in size and appearance. Hydrocele:  None visualized. 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 Left scrotal veins measure up 3.8 mm with Valsalva. Pulsed Doppler interrogation of both testes  demonstrates normal low resistance arterial and venous waveforms bilaterally. IMPRESSION: 1. No evidence of testicular mass or torsion. 2. 1 mm calcification lower pole right testicle without microlithiasis. 3. Left-greater-than-right bilateral varicoceles.  No hydroceles. 4. 6 mm left epididymal head simple cyst. Electronically Signed   By: Francis Quam M.D.   On: 02/02/2022 21:18    Assessment & Plan:  Skin lesion, superficial -     Ambulatory referral to Dermatology  Current mild episode of major depressive disorder without prior episode- No labs done at his request due to the cost. -     lamoTRIgine ; Take 1 tablet (50 mg total) by mouth 2 (two) times daily.  Dispense: 180 tablet; Refill: 1 -  Sertraline  HCl; Take 1 tablet (100 mg total) by mouth daily.  Dispense: 90 tablet; Refill: 1     Follow-up: No follow-ups on file.  Debby Molt, MD

## 2024-10-01 ENCOUNTER — Ambulatory Visit: Payer: Self-pay

## 2024-10-22 ENCOUNTER — Ambulatory Visit: Payer: Self-pay
